# Patient Record
Sex: Female | Born: 1970
Health system: Southern US, Community
[De-identification: ages and names within clinical notes are randomized; demographics above are authoritative.]

## PROBLEM LIST (undated history)

## (undated) DIAGNOSIS — M722 Plantar fascial fibromatosis: Secondary | ICD-10-CM

## (undated) DIAGNOSIS — Z8619 Personal history of other infectious and parasitic diseases: Secondary | ICD-10-CM

## (undated) DIAGNOSIS — IMO0001 Reserved for inherently not codable concepts without codable children: Secondary | ICD-10-CM

## (undated) DIAGNOSIS — T7840XA Allergy, unspecified, initial encounter: Secondary | ICD-10-CM

## (undated) DIAGNOSIS — Z9889 Other specified postprocedural states: Secondary | ICD-10-CM

## (undated) DIAGNOSIS — R112 Nausea with vomiting, unspecified: Secondary | ICD-10-CM

## (undated) DIAGNOSIS — I10 Essential (primary) hypertension: Secondary | ICD-10-CM

## (undated) HISTORY — DX: Essential (primary) hypertension: I10

## (undated) HISTORY — DX: Personal history of other infectious and parasitic diseases: Z86.19

## (undated) HISTORY — DX: Plantar fascial fibromatosis: M72.2

## (undated) HISTORY — DX: Reserved for inherently not codable concepts without codable children: IMO0001

## (undated) HISTORY — DX: Allergy, unspecified, initial encounter: T78.40XA

---
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
## 1996-11-30 DIAGNOSIS — IMO0001 Reserved for inherently not codable concepts without codable children: Secondary | ICD-10-CM

## 1996-11-30 HISTORY — PX: OTHER SURGICAL HISTORY: SHX169

## 1996-11-30 HISTORY — DX: Reserved for inherently not codable concepts without codable children: IMO0001

## 1997-11-30 HISTORY — PX: WISDOM TOOTH EXTRACTION: SHX21

## 2008-07-31 ENCOUNTER — Encounter: Admission: RE | Admit: 2008-07-31 | Discharge: 2008-07-31 | Payer: Self-pay | Admitting: Family Medicine

## 2008-08-16 LAB — CONVERTED CEMR LAB: Pap Smear: NORMAL

## 2009-02-07 ENCOUNTER — Ambulatory Visit: Payer: Self-pay | Admitting: *Deleted

## 2009-02-07 DIAGNOSIS — B019 Varicella without complication: Secondary | ICD-10-CM | POA: Insufficient documentation

## 2009-02-07 DIAGNOSIS — R51 Headache: Secondary | ICD-10-CM | POA: Insufficient documentation

## 2009-02-07 DIAGNOSIS — G43909 Migraine, unspecified, not intractable, without status migrainosus: Secondary | ICD-10-CM | POA: Insufficient documentation

## 2009-02-07 DIAGNOSIS — J309 Allergic rhinitis, unspecified: Secondary | ICD-10-CM | POA: Insufficient documentation

## 2009-02-07 DIAGNOSIS — I10 Essential (primary) hypertension: Secondary | ICD-10-CM | POA: Insufficient documentation

## 2009-02-07 DIAGNOSIS — R519 Headache, unspecified: Secondary | ICD-10-CM | POA: Insufficient documentation

## 2009-02-07 LAB — CONVERTED CEMR LAB
BUN: 12 mg/dL (ref 6–23)
CO2: 30 meq/L (ref 19–32)
Calcium: 8.8 mg/dL (ref 8.4–10.5)
Chloride: 105 meq/L (ref 96–112)
Creatinine, Ser: 0.8 mg/dL (ref 0.4–1.2)
GFR calc Af Amer: 104 mL/min
GFR calc non Af Amer: 86 mL/min
Glucose, Bld: 76 mg/dL (ref 70–99)
Potassium: 3.8 meq/L (ref 3.5–5.1)
Sodium: 142 meq/L (ref 135–145)

## 2009-03-12 ENCOUNTER — Ambulatory Visit: Payer: Self-pay | Admitting: Family Medicine

## 2009-03-12 ENCOUNTER — Encounter: Payer: Self-pay | Admitting: Family Medicine

## 2009-03-12 ENCOUNTER — Other Ambulatory Visit: Admission: RE | Admit: 2009-03-12 | Discharge: 2009-03-12 | Payer: Self-pay | Admitting: Family Medicine

## 2009-03-12 DIAGNOSIS — R635 Abnormal weight gain: Secondary | ICD-10-CM | POA: Insufficient documentation

## 2009-03-12 DIAGNOSIS — N879 Dysplasia of cervix uteri, unspecified: Secondary | ICD-10-CM | POA: Insufficient documentation

## 2009-03-12 LAB — CONVERTED CEMR LAB
ALT: 13 units/L (ref 0–35)
AST: 19 units/L (ref 0–37)
Albumin: 3.8 g/dL (ref 3.5–5.2)
Alkaline Phosphatase: 74 units/L (ref 39–117)
BUN: 11 mg/dL (ref 6–23)
CO2: 28 meq/L (ref 19–32)
Calcium: 9 mg/dL (ref 8.4–10.5)
Chloride: 105 meq/L (ref 96–112)
Cholesterol: 128 mg/dL (ref 0–200)
Creatinine, Ser: 0.7 mg/dL (ref 0.4–1.2)
Direct LDL: 64.4 mg/dL
GFR calc non Af Amer: 99.92 mL/min (ref >60)
Glucose, Bld: 93 mg/dL (ref 70–99)
HDL: 36.1 mg/dL — ABNORMAL LOW (ref >39.00)
Potassium: 3.8 meq/L (ref 3.5–5.1)
Sodium: 140 meq/L (ref 135–145)
TSH: 1.8 microintl units/mL (ref 0.35–5.50)
Total Bilirubin: 0.8 mg/dL (ref 0.3–1.2)
Total Protein: 7.2 g/dL (ref 6.0–8.3)

## 2009-03-12 LAB — HM PAP SMEAR

## 2009-08-30 ENCOUNTER — Ambulatory Visit: Payer: Self-pay | Admitting: Internal Medicine

## 2009-10-31 ENCOUNTER — Ambulatory Visit: Payer: Self-pay | Admitting: Internal Medicine

## 2009-11-05 ENCOUNTER — Telehealth: Payer: Self-pay | Admitting: Internal Medicine

## 2010-01-30 ENCOUNTER — Ambulatory Visit: Payer: Self-pay | Admitting: Internal Medicine

## 2010-01-31 ENCOUNTER — Encounter (INDEPENDENT_AMBULATORY_CARE_PROVIDER_SITE_OTHER): Payer: Self-pay | Admitting: *Deleted

## 2010-02-13 ENCOUNTER — Telehealth (INDEPENDENT_AMBULATORY_CARE_PROVIDER_SITE_OTHER): Payer: Self-pay | Admitting: *Deleted

## 2011-01-01 NOTE — Assessment & Plan Note (Signed)
Summary: 3 MONTH FOLLOW UP/MHF   Vital Signs:  Patient profile:   40 year old [name] Menstrual status:  regular Weight:      204.25 pounds BMI:     34.11 O2 Sat:      98 % on Room air Temp:     97.6 degrees F oral Pulse rate:   74 / minute Pulse rhythm:   regular Resp:     [place] per minute BP sitting:   120 / 80  (right arm) Cuff size:   large  Vitals Entered By: [name] (January 30, 2010 3:25 PM)  O2 Flow:  Room air CC: RM 3- 3 month follow up  Comments no concerns, refill for HCTZ   Primary Care Provider:  [name]  CC:  RM 3- 3 month follow up .  History of Present Illness:  Hypertension Follow-Up      This is a 40 year old woman who presents for Hypertension follow-up.  The patient reports urinary frequency, but denies lightheadedness and headaches.  The patient denies the following associated symptoms: chest pain.  Compliance with medications (by patient report) has been near 100%.  The patient reports that dietary compliance has been fair.    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   Neck:  supple and no masses.   Lungs:  normal respiratory effort and normal breath sounds.   Heart:  normal rate, regular rhythm, and no gallop.   Abdomen:  soft, non-tender, and normal bowel  sounds.   Extremities:  no LE edema   Impression & Recommendations:  Problem # 1:  HYPERTENSION (ICD-401.9) well controlled.   Maintain current medication regimen.  Her updated medication list for this problem includes:    Hydrochlorothiazide 12.5 Mg Caps (Hydrochlorothiazide) .Marland Kitchen... 1 by mouth once daily    Labetalol Hcl 100 Mg Tabs (Labetalol hcl) ..... One half by mouth bid  BP today: 120/80 Prior BP: 136/90 (10/31/2009)  Labs Reviewed: K+: 3.8 (03/12/2009) Creat: : 0.7 (03/12/2009)   Chol: 128 (03/12/2009)   HDL: 36.10 (03/12/2009)     Problem # 2:  HEADACHE (ICD-784.0) Assessment: Improved continue b blocker  Her updated medication list for this problem includes:    Labetalol Hcl 100 Mg Tabs (Labetalol hcl) ..... One half by mouth bid  Problem # 3:  FAMILY PLANNING (ICD-V25.09) Pt and husband trying for first baby.  pt adivsed to take pre natal vitamins.  refer to local OB Orders: Obstetric Referral (Obstetric)  Complete Medication List: 1)  Previfem 0.25-35 Mg-mcg Tabs (Norgestimate-eth estradiol) .Marland Kitchen.. 1 tab by mouth daily 2)  Hydrochlorothiazide 12.5 Mg Caps (Hydrochlorothiazide) .Marland Kitchen.. 1 by mouth once daily 3)  Promethazine Hcl 12.5 Mg Tabs (Promethazine hcl) .... One by mouth two times a day prn 4)  Labetalol Hcl 100 Mg Tabs (Labetalol hcl) .... One half by mouth bid Prescriptions: LABETALOL HCL  100 MG TABS (LABETALOL HCL) one half by mouth bid  #90 x 3   Entered and Authorized by:   D. Thomos Lemons DO   Signed by:   D. Thomos Lemons DO on 01/30/2010   Method used:   Faxed to ...       [facility] Delivery Fax) (mail-order)             ,          Ph: [phone]       Fax: [phone]   RxID:   [id number] HYDROCHLOROTHIAZIDE 12.5 MG CAPS (HYDROCHLOROTHIAZIDE) 1 by mouth once daily  #90 x 3   Entered and Authorized by:   D. Thomos Lemons DO   Signed by:   D. Thomos Lemons DO on 01/30/2010   Method used:   Faxed to ...       [facility] Delivery  Fax) (mail-order)             ,          Ph: [phone]       Fax: [phone]   RxID:   [id number] LABETALOL HCL 100 MG TABS (LABETALOL HCL) one half by mouth bid  #90 x 3   Entered and Authorized by:   D. Thomos Lemons DO   Signed by:   D. Thomos Lemons DO on 01/30/2010   Method used:   Print then Give to Patient   RxID:   [id number] HYDROCHLOROTHIAZIDE 12.5 MG CAPS (HYDROCHLOROTHIAZIDE) 1 by mouth once daily  #90 x 3   Entered and Authorized by:   D. Thomos Lemons DO   Signed by:   D. Thomos Lemons DO on 01/30/2010   Method used:   Print then Give to Patient   RxID:   [id number]    Orders Added: 1)  Obstetric Referral [Obstetric] 2)  Est. Patient Level III [32202]   Current Allergies (reviewed today): No known allergies

## 2011-01-01 NOTE — Assessment & Plan Note (Signed)
Summary: CPE with pap   Vital Signs:  Patient profile:   40 year old female Menstrual status:  regular LMP:     02/13/2009 Height:      65 inches Weight:      197 pounds BMI:     32.90 O2 Sat:      99 % Pulse rate:   70 / minute BP sitting:   126 / 80  (left arm) Cuff size:   large  Vitals Entered By: Harlene Salts (March 12, 2009 8:58 AM)  History of Present Illness: CPE no pap  40 yo WF presents for CPE.  Had her pap smear with Dr Presley Raddle in September.  Has had abnormal paps.  Hx of dysplasia.  She had a LEEP done in the past.  Her periods are regular.  On OCPs.  No breakthrough bleeding.  No problems with discharge.  Monogamous.  She is nulligravid.  Not desiring to get pregnant.  She is on HCTZ for BP.  Due for RFs.    Aunt with breast cancer, postmenopausal. No fam hx of colon cancer. No fam hx of premature heart dz.  Tetanus updated in 2004.   She takes MVI on occassion.  She irregularly exercises.    Allergies: No Known Drug Allergies  Past History:  Past Medical History:    Reviewed history from 02/07/2009 and no changes required:    Current Problems:     MIGRAINE HEADACHE (ICD-346.90)    HYPERTENSION (ICD-401.9)    ALLERGY (ICD-995.3)    HEADACHE (ICD-784.0)    CHICKENPOX (ICD-052.9)  Past Surgical History:    Reviewed history from 02/07/2009 and no changes required:    knee surgery 1986    dyplasia 1998    wisdom teeth 1999  Family History:    Reviewed history from 02/07/2009 and no changes required:       Family History of Alcoholism/Addiction       Family History of  Emotional/Mental illness       Family History Lung cancer       Family History of  breast cancer       Family History of  stroke  Social History:    Reviewed history from 02/07/2009 and no changes required:       Occupation:Engineer       Single       Never Smoked       Alcohol use-yes  Review of Systems  The patient denies anorexia, fever, weight loss, weight gain, vision  loss, decreased hearing, hoarseness, chest pain, syncope, dyspnea on exertion, peripheral edema, prolonged cough, headaches, hemoptysis, abdominal pain, melena, hematochezia, severe indigestion/heartburn, hematuria, incontinence, genital sores, muscle weakness, suspicious skin lesions, transient blindness, difficulty walking, depression, unusual weight change, abnormal bleeding, enlarged lymph nodes, angioedema, breast masses, and testicular masses.    Physical Exam  General:  alert, well-developed, well-nourished, and well-hydrated.  obese. Head:  normocephalic and atraumatic.   Eyes:  vision grossly intact.   Ears:  no external deformities.   Nose:  no external deformity and no nasal discharge.   Mouth:  good dentition and pharynx pink and moist.   Neck:  supple and no masses.   Breasts:  had CBE 9-09 Lungs:  Normal respiratory effort, chest expands symmetrically. Lungs are clear to auscultation, no crackles or wheezes. Heart:  Normal rate and regular rhythm. S1 and S2 normal without gallop, murmur, click, rub or other extra sounds. Abdomen:  soft, non-tender, normal bowel sounds, no distention, no masses, no  guarding, no hepatomegaly, and no splenomegaly.   Genitalia:  Pelvic Exam:        External: normal female genitalia without lesions or masses        Vagina: normal without lesions or masses        Cervix: normal without lesions or masses        Adnexa: normal bimanual exam without masses or fullness        Uterus: normal by palpation        Pap smear: performed Pulses:  2+ radial pulses Extremities:  no LE edema Skin:  color normal.   Cervical Nodes:  No lymphadenopathy noted Psych:  good eye contact, not anxious appearing, and flat affect.     Impression & Recommendations:  Problem # 1:  Gynecological examination-routine (ICD-V72.31) Thin prep pap updated.  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 RFd OCPs and HCTZ.  BP at goal. BMI obese.  Work on diet, exercise  and wt loss. Add MVI and Calcium/ D daily. Tetanus is UTD. Update labs.  Mammogram at 40.    Complete Medication List: 1)  Previfem 0.25-35 Mg-mcg Tabs (Norgestimate-eth estradiol) .Marland Kitchen.. 1 tab by mouth daily 2)  Hydrochlorothiazide 12.5 Mg Caps (Hydrochlorothiazide) .Marland Kitchen.. 1 by mouth once daily  Other Orders: TLB-CMP (Comprehensive Metabolic Pnl) (80053-COMP) TLB-Cholesterol, Total (82465-CHO) TLB-Cholesterol, HDL (83718-HDL) TLB-Cholesterol, Direct LDL (83721-DIRLDL) TLB-TSH (Thyroid Stimulating Hormone) (84443-TSH)  Patient Instructions: 1)  Pap smear updated today. 2)  Will call you w/ results in 2-3 days. 3)  Update fasting labs today. 4)  Will call you w/ results. 5)  Add a Women's MVI and Calcium/D daily. 6)  Work on [employer] and regular exercise.  7)  F/U for High BP in 6 months. Prescriptions: HYDROCHLOROTHIAZIDE 12.5 MG CAPS (HYDROCHLOROTHIAZIDE) 1 by mouth once daily  #90 x 2   Entered and Authorized by:   Seymour Bars DO   Signed by:   Seymour Bars DO on 03/12/2009   Method used:   Print then Give to Patient   RxID:   [id number] PREVIFEM 0.25-35 MG-MCG TABS (NORGESTIMATE-ETH ESTRADIOL) 1 tab by mouth daily  #3 months x 3   Entered and Authorized by:   Seymour Bars DO   Signed by:   Seymour Bars DO on 03/12/2009   Method used:   Print then Give to Patient   RxID:   [id number]

## 2011-01-01 NOTE — Letter (Signed)
Summary: Primary Care Consult Scheduled Letter  [place] [facility]  [address]   [place], [place] [address]   Phone: [phone]  Fax: [phone]      01/31/2010 MRN: [id number]  Kaliyan Mcwatters [address] [place], [place]  [address]    Dear Ms. Phebus,      We have scheduled an appointment for you.  At the recommendation of Dr.YOO, we have scheduled you a consult with DR Zelphia Cairo, PHYSICIAN FOR WOMEN  on MARCH 9,2011  at 2:45PM , ARRIVE 2:15PM .  Their address is [address],   [place]  . The office phone number is [phone].  If this appointment day and time is not convenient for you, please feel free to call the office of the doctor you are being referred to at the number listed above and reschedule the appointment.     It is important for you to keep your scheduled appointments. We are here to make sure you are given good patient care. If you have questions or you have made changes to your appointment, please notify us at  [phone], ask for HELEN.    Thank you,  Patient Care Coordinator  at [facility]

## 2011-01-01 NOTE — Assessment & Plan Note (Signed)
Summary: Headache/mhf   Vital Signs:  Patient profile:   40 year old female Menstrual status:  regular Height:      65 inches Weight:      202 pounds Temp:     98.0 degrees F oral Pulse rate:   60 / minute BP sitting:   136 / 100  (left arm) Cuff size:   regular  Vitals Entered By: Kathlene November (August 30, 2009 3:53 PM) CC: Headache   Primary Care Provider:  [name]  CC:  Headache.  History of Present Illness:  Headaches      This is a 40 year old woman who presents with Headaches.  The patient denies nausea, vomiting, sinus pain, and sinus pressure.  The headache is described as intermittent.  The patient denies the following high-risk features: fever.  She has hx of presumed sinus problems.  Previous sinus treatment have not helped headaches.  Current Medications (verified): 1)  Previfem 0.25-35 Mg-Mcg Tabs (Norgestimate-Eth Estradiol) .Marland Kitchen.. 1 Tab By Mouth Daily 2)  Hydrochlorothiazide 12.5 Mg Caps (Hydrochlorothiazide) .Marland Kitchen.. 1 By Mouth Once Daily  Allergies (verified): No Known Drug Allergies  Comments:  Nurse/Medical Assistant: The patient's medications and allergies were reviewed with the patient and were updated in the Medication and Allergy Lists. 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   Head:  normocephalic and atraumatic.   Eyes:  vision grossly intact, pupils equal, pupils round, and pupils reactive to light.   Ears:  R ear normal and L ear normal.   Mouth:  Oral mucosa and oropharynx without lesions or exudates.  Teeth in good repair. Neck:  supple and no masses.   Lungs:  normal respiratory effort and normal  breath sounds.   Heart:  normal rate, regular rhythm, and no gallop.   Neurologic:  cranial nerves II-XII intact, strength normal in all extremities, gait normal, and finger-to-nose normal.   Psych:  normally interactive, good eye contact, not anxious appearing, and not depressed appearing.     Impression & Recommendations:  Problem # 1:  HEADACHE (ICD-784.0) 40 y/o white female with intermittent headaches.  Prev exacerbations attributed to sinus infecttion/problem.  I suspect migraines.  Start labetalol for headache prophylaxis.  She is getting married and she may want to start family which precludes use of other agents like topamax or ACE inhibitors.  Her updated medication list for this problem includes:    Labetalol Hcl 100 Mg Tabs (Labetalol hcl) ..... One half by mouth bid  Complete Medication List: 1)  Previfem 0.25-35 Mg-mcg Tabs (Norgestimate-eth estradiol) .Marland Kitchen.. 1 tab by mouth daily 2)  Hydrochlorothiazide 12.5 Mg Caps (Hydrochlorothiazide) .Marland Kitchen.. 1 by mouth once daily 3)  Promethazine Hcl 12.5 Mg Tabs (Promethazine hcl) .... One by mouth two times a day prn 4)  Labetalol Hcl 100 Mg Tabs (Labetalol hcl) .... One half by mouth bid  Patient Instructions: 1)  Please schedule a follow-up appointment in 2 months. Prescriptions: LABETALOL HCL 100 MG TABS (LABETALOL HCL) one half by mouth bid  #30 x 2   Entered and Authorized by:   D. Thomos Lemons DO   Signed by:   D. Thomos Lemons DO on 08/30/2009  Method used:   Print then Give to Patient   RxID:   [id number] PROMETHAZINE HCL 12.5 MG TABS (PROMETHAZINE HCL) one by mouth two times a day prn  #30 x 0   Entered and Authorized by:   D. Thomos Lemons DO   Signed by:   D. Thomos Lemons DO on 08/30/2009   Method used:   Print then Give to Patient   RxID:   [id number]

## 2011-01-01 NOTE — Assessment & Plan Note (Signed)
Summary: 2 month follow up/mhf   Vital Signs:  Patient profile:   40 year old female Menstrual status:  regular Weight:      202.75 pounds BMI:     33.86 O2 Sat:      97 % on Room air Temp:     98.1 degrees F oral Pulse rate:   78 / minute Pulse rhythm:   regular Resp:     16 per minute BP sitting:   136 / 90  (right arm) Cuff size:   large  Vitals Entered By: [name] (October 31, 2009 3:20 PM)  O2 Flow:  Room air  Primary Care Kairos Panetta:  D. Thomos Lemons DO  CC:  2 Month Follow up.  History of Present Illness: 2 Month follow up  40 year old white female for followup for migraines and htn. Patient only using labetalol- half tablet at bedtime. Headaches are  improved.  Htn - good medication compliance with diuretic.  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   Lungs:  normal respiratory effort and normal breath sounds.   Heart:  normal rate, regular rhythm, and no gallop.   Neurologic:  cranial nerves II-XII intact and gait normal.     Impression & Recommendations:  Problem # 1:  HEADACHE (ICD-784.0) Assessment Unchanged Pt has been only taking labetalol at bed time.  I advised pt to take two times a day.    Her updated medication list for this problem includes:    Labetalol Hcl 100 Mg Tabs (Labetalol hcl) ..... One half by mouth bid  Problem # 2:  HYPERTENSION (ICD-401.9) Assessment: Improved Take labetalol two times a day instead of at bedtime.   monitor BP and HR at home. Her updated medication list for this problem includes:    Hydrochlorothiazide 12.5 Mg Caps (Hydrochlorothiazide) .Marland Kitchen... 1 by mouth once daily    Labetalol Hcl 100 Mg Tabs (Labetalol hcl) ..... One half by mouth bid  BP today: 136/90 Prior BP: 136/100 (08/30/2009)  Labs Reviewed: K+: 3.8 (03/12/2009) Creat: : 0.7 (03/12/2009)   Chol: 128 (03/12/2009)   HDL: 36.10 (03/12/2009)     Complete Medication List: 1)  Previfem 0.25-35 Mg-mcg Tabs (Norgestimate-eth estradiol) .Marland Kitchen.. 1 tab by mouth daily 2)  Hydrochlorothiazide 12.5 Mg Caps (Hydrochlorothiazide) .Marland Kitchen.. 1 by mouth once daily 3)  Promethazine Hcl 12.5 Mg Tabs (Promethazine hcl) .... One by mouth two times a day prn 4)  Labetalol Hcl 100 Mg Tabs (Labetalol hcl) .... One half by mouth bid  Patient Instructions: 1)  Please schedule a follow-up appointment in 3 months. Prescriptions: LABETALOL HCL 100 MG TABS (LABETALOL HCL) one half by mouth bid  #90 x 3   Entered and Authorized by:   D. Thomos Lemons DO   Signed by:   D. Thomos Lemons DO on 10/31/2009   Method used:   Faxed to ...       [facility] Delivery Fax) (mail-order)             ,          Ph: [phone]  Fax: [phone]   RxID:   [id number]   Current Allergies (reviewed today): No known allergies     Immunization History:  Influenza Immunization History:    Influenza:  declined (10/31/2009)   Contraindications/Deferment of Procedures/Staging:    Test/Procedure: FLU VAX    Reason for deferment: patient declined

## 2011-01-01 NOTE — Progress Notes (Signed)
Summary: HCTZ on hold  Phone Note Call from Patient   Summary of Call: Pt. has a question Re: her scripts .... How they are ordered and how the refills work. Call back patient at  [phone] Initial call taken by: Michaelle Copas,  February 13, 2010 1:51 PM  Follow-up for Phone Call        Pt. states that she asked Dr. Artist Pais to update her medications at last visit. HCTZ refills were sent to [facility]. Pt saw Dr. Vickey Sages (GYN) and was told to stop HCTZ as pt. is planning to start a family.  Pt states that [facility] usually calls her before sending out refills on her meds but they did not do that this time. She now has 90 day supply of HCTZ that she is no longer taking.  She states that she was told to call us and have [place] call [facility] and tell them we misfilled the prescription. I advised pt. to call [facility] and speak to a manager to see if they can correct their error in not calling the pt. before shipping the med. We did not misfill her script. I will take HCTZ off her med list as it is on hold at present. Follow-up by: Mervin Kung CMA,  February 13, 2010 2:53 PM

## 2011-01-01 NOTE — Progress Notes (Signed)
Summary: Pharmacy verification  Phone Note Outgoing Call   Call placed by: [name],  November 05, 2009 11:37 AM Call placed to: Patient Summary of Call: call placed to patient  at [phone] for verification of where she would to have her Labetolol sent. Fax was received from [facility] stating patient has inactive coverage through their pharmacy. Initial call taken by: [name],  November 05, 2009 11:38 AM  Follow-up for Phone Call        Please resend to [facility] because they denied her med. because of her name change, but pt. states that she got it all straight now, if you will just resend. Thank you Follow-up by: Michaelle Copas,  November 07, 2009 9:43 AM  Additional Follow-up for Phone Call Additional follow up Details #1::        Phone call completed, rx faxed to pharmacy Additional Follow-up by: [name],  November 07, 2009 9:47 AM    Prescriptions: LABETALOL HCL 100 MG TABS (LABETALOL HCL) one half by mouth bid  #90 x 3   Entered by:   [name]   Authorized by:   D. Thomos Lemons DO   Signed by:   [name] on 11/07/2009   Method used:   Faxed to ...       [facility] Delivery Fax) (mail-order)             ,          Ph: [phone]       Fax: [phone]   RxID:   [id number]

## 2011-01-01 NOTE — Assessment & Plan Note (Signed)
Summary: NEW TO EST SINUS INFECTION-CH   Vital Signs:  Patient profile:   40 year old female Menstrual status:  regular LMP:     12/29/2008 Height:      65 inches Weight:      201 pounds BMI:     33.57 Temp:     98.6 degrees F oral Pulse rate:   88 / minute Pulse rhythm:   regular Resp:     20 per minute BP sitting:   148 / 98  (right arm) Cuff size:   regular  Vitals Entered By: Darra Lis RMA (February 07, 2009 1:38 PM) CC: sinusitis, Headache Is Patient Diabetic? No LMP (date): 12/29/2008  years   days  Menstrual Status regular Enter LMP: 12/29/2008 Last PAP Result normal   History of Present Illness: Patient is here because of worsening sinus pressure and headaches x 3 weeks.  Patient with a past history of sinusitis .  The patient complains of nasal congestion, purulent nasal discharge, and productive cough, but denies sore throat, earache, and sick contacts.  The patient denies fever, stiff neck, dyspnea, wheezing, rash, vomiting, and diarrhea.  The patient also reports itchy throat, sneezing, and headache.  The patient denies itchy watery eyes, muscle aches, and severe fatigue.    patient with a history of seasonal allergies, but the current symptoms are not like her typical allergy symptoms - it is like past episodes of sinusitis.  rarely uses meds for allergy symptoms.  Patient was prescribed blood pressure medication by her GYN last year, but she stopped taking it because it made her feel strange. Patient is currently taking nothing for her blood pressure - for several months.  patient with a history of migraines - uses over the counter meds.  has had some increased headaches in the past 3 weeks, but not migraine type headache - they are more related to sinusitis per patient.  patient is UTD with her pap smears   Preventive Screening-Counseling & Management     Alcohol drinks/day: 3 on the weekends     Alcohol type: wine     Feels need to cut down: no     Feels  annoyed by complaints: no     Feels guilty re: drinking: no     Needs 'eye opener' in am: no     Smoking Status: never     Passive Smoke Exposure: no     Caffeine use/day: 1 a day     Does Patient Exercise: yes     Type of exercise: cardio/strength training/walking     Seat Belt Use: yes  Current Medications (verified): 1)  Previfem 0.25-35 Mg-Mcg Tabs (Norgestimate-Eth Estradiol) .... Use As Directed  Allergies (verified): No Known Drug Allergies  Past Medical History:    Current Problems:     MIGRAINE HEADACHE (ICD-346.90)    HYPERTENSION (ICD-401.9)    ALLERGY (ICD-995.3)    HEADACHE (ICD-784.0)    CHICKENPOX (ICD-052.9)      Past Surgical History:    knee surgery 1986    dyplasia 1998    wisdom teeth 1999  Family History:    Family History of Alcoholism/Addiction    Family History of  Emotional/Mental illness    Family History Lung cancer    Family History of  breast cancer    Family History of  stroke  Social History:    Occupation:Engineer    Single    Never Smoked    Alcohol use-yes    Smoking Status:  never    Passive Smoke Exposure:  no    Caffeine use/day:  1 a day    Does Patient Exercise:  yes    Seat Belt Use:  yes  Review of Systems  The patient denies anorexia, fever, weight loss, weight gain, vision loss, decreased hearing, chest pain, syncope, dyspnea on exertion, peripheral edema, prolonged cough, hemoptysis, abdominal pain, melena, hematochezia, severe indigestion/heartburn, hematuria, genital sores, muscle weakness, suspicious skin lesions, transient blindness, difficulty walking, depression, unusual weight change, abnormal bleeding, enlarged lymph nodes, angioedema, and breast masses.    Physical Exam  Additional Exam:  General:     Well-developed, well nourished, well hydrated, in no acute distress   Head:     Normocephalic and atraumatic without obvious abnormalities.  Eyes:     No corneal or conjunctival inflammation. EOMI. PERRLA.  fundi benign.  Vision grossly normal. Ears:     External ear shows no significant lesions or deformities.  Otoscopic examination reveals clear canals, tympanic membranes normal bilaterally. Hearing is grossly normal. Nose:     External nasal examination shows no deformity or inflammation. Nasal mucosa are pink and moist without lesions or exudates. some tenderness to palpation of sinuses Mouth:     Oral mucosa and oropharynx without lesions, erythema or exudates.  no postnasal drip and no tongue abnormalities.   Neck:     supple, full ROM  Chest Wall:     No deformities, masses, or tenderness noted. Lungs:     Normal respiratory effort, chest expands symmetrically with good air flow noted. Lungs clear to auscultation with no crackles, rales or wheezes.   Heart:     Normal rate and  rhythm. S1 and S2 normal, without gallop, murmur, click, rub  Abdomen:     Bowel sounds positive, abdomen soft and non-tender without masses, organomegaly or hernias noted. no distention  Msk:     No gross deformity noted of cervical, thoracic or lumbar spine. normal ROM. no muscle atrophy noted.   Extremities:     No clubbing, cyanosis, edema, or deformity noted, grossly normal full range of motion with all joints.   Neurologic:     alert & oriented X3,  gait normal.  no deficit in strength noted, no balance problems noted, neuro is grossly intact Skin:     Intact without suspicious lesions or rashes Psych:     Cognition and judgment appear intact. Alert and cooperative with normal attention span and concentration. No apparent delusions, illusions, hallucinations, normally interactive, good eye contact, not anxious or depressed appearing, and not agitated.      Impression & Recommendations:  Problem # 1:  SINUSITIS (ICD-473.9) will treat with augmentin.  Get plenty of rest, drink lots of clear liquids, and use Tylenol or Ibuprofen for fever and comfort. If sinus symptoms, then use warm moist compresses,  and over the counter decongestants (only as directed). Call if no improvement in 5-7 days, sooner if increasing pain, fever, or new symptoms.  Her updated medication list for this problem includes:    Amoxicillin-pot Clavulanate 875-125 Mg Tabs (Amoxicillin-pot clavulanate) .Marland Kitchen... 1 by mouth two times a day x 10 day  Problem # 2:  HYPERTENSION (ICD-401.9) will start HCTZ and follow up in one month Her updated medication list for this problem includes:    Hydrochlorothiazide 12.5 Mg Caps (Hydrochlorothiazide) .Marland Kitchen... 1 by mouth once daily  Orders: TLB-BMP (Basic Metabolic Panel-BMET) (80048-METABOL)  Problem # 3:  MIGRAINE HEADACHE (ICD-346.90) uses over the counter meds with good  relief  Problem # 4:  ALLERGIC RHINITIS (ICD-477.9)  over the counter meds prn  Her updated medication list for this problem includes:    Claritin 10 Mg Tabs (Loratadine) ..... One by mouth once daily as needed  Complete Medication List: 1)  Previfem 0.25-35 Mg-mcg Tabs (Norgestimate-eth estradiol) .... Use as directed 2)  Hydrochlorothiazide 12.5 Mg Caps (Hydrochlorothiazide) .Marland Kitchen.. 1 by mouth once daily 3)  Amoxicillin-pot Clavulanate 875-125 Mg Tabs (Amoxicillin-pot clavulanate) .Marland Kitchen.. 1 by mouth two times a day x 10 day 4)  Claritin 10 Mg Tabs (Loratadine) .... One by mouth once daily as needed  Patient Instructions: 1)  Please schedule a PE appointment in 1 month with MD in the am / fasting.  NOT just a lab appt.  2)  The medication list was reviewed and reconciled.  All changed / newly prescribed medications were explained.  A complete medication list was provided to the patient / caregiver. Prescriptions: AMOXICILLIN-POT CLAVULANATE 875-125 MG TABS (AMOXICILLIN-POT CLAVULANATE) 1 by mouth two times a day x 10 day  #20 x 0   Entered and Authorized by:   Paulo Fruit MD   Signed by:   Paulo Fruit MD on 02/07/2009   Method used:   [facility]        [facility] [phone]* (retail)        [address]       [place], [place]  [address]       Ph: [phone]       Fax: [phone]   RxID:   [id number] HYDROCHLOROTHIAZIDE 12.5 MG CAPS (HYDROCHLOROTHIAZIDE) 1 by mouth once daily  #30 x 1   Entered and Authorized by:   Paulo Fruit MD   Signed by:   Paulo Fruit MD on 02/07/2009   Method used:   [facility]        [facility] [phone]* (retail)       [address]       [place], [place]  [address]       Ph: [phone]       Fax: [phone]   RxID:   [id number]        Preventive Care Screening  Pap Smear:    Date:  08/16/2008    Results:  normal   Last Tetanus Booster:    Date:  02/21/2004    Results:  Tdap

## 2011-01-30 ENCOUNTER — Encounter: Payer: Self-pay | Admitting: Internal Medicine

## 2011-01-30 ENCOUNTER — Ambulatory Visit (INDEPENDENT_AMBULATORY_CARE_PROVIDER_SITE_OTHER): Payer: BC Managed Care – PPO | Admitting: Internal Medicine

## 2011-01-30 DIAGNOSIS — I10 Essential (primary) hypertension: Secondary | ICD-10-CM

## 2011-01-30 LAB — CONVERTED CEMR LAB

## 2011-02-17 NOTE — Assessment & Plan Note (Signed)
Summary: 1 YEAR FOLLOW UP/MHF   Vital Signs:  Patient profile:   40 year old female Menstrual status:  regular Height:      65 inches Weight:      209.75 pounds BMI:     35.03 O2 Sat:      99 % Temp:     98.3 degrees F oral Pulse rate:   84 / minute Resp:     18 per minute BP sitting:   116 / 80  (right arm) Cuff size:   large  Vitals Entered By: [name] (January 30, 2011 3:17 PM) CC: Yearly follow up Is Patient Diabetic? No Pain Assessment Patient in pain? no      Comments no concerns     Last PAP Result due   Primary Care Provider:  [name]  CC:  Yearly follow up.  History of Present Illness: 40 year old white female for routine followup No significant interval history Patient still trying to conceive.  it has been approximately one year  menstrual cycle can be somewhat irregular she ranges from q. 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  Alcohol-Tobacco     Alcohol drinks/day: <1     Smoking Status: never  Caffeine-Diet-Exercise     Caffeine use/day: 2 beverages daily     Does Patient Exercise: no  Allergies (verified): No Known Drug Allergies  Past History:  Past Medical History: Current Problems:  MIGRAINE HEADACHE (ICD-346.90) HYPERTENSION (ICD-401.9)  ALLERGY (ICD-995.3)   HEADACHE (ICD-784.0) CHICKENPOX (ICD-052.9)   Hx of plantar fascitis  Family History: Family History of Alcoholism/Addiction Family History of  Emotional/Mental illness Family History Lung cancer  Family History of  breast cancer Family History of  stroke     Social History: 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   The patient denies fever, chest pain, syncope, dyspnea on exertion, abdominal pain, melena, hematochezia, and severe indigestion/heartburn.    Physical Exam  General:  alert, well-developed, and well-nourished.   Lungs:  normal respiratory effort and normal breath sounds.   Heart:  normal rate, regular rhythm, and no gallop.   Extremities:  trace left pedal edema and trace right pedal edema.   Psych:  normally interactive, good eye contact, not anxious appearing, and not depressed appearing.     Impression & Recommendations:  Problem # 1:  HYPERTENSION (ICD-401.9) Assessment Unchanged bp is stable. she notes occ elevated readings he has health screening at work pt to forward blood test results  encouraged regular exercise  Her updated medication list for this problem includes:    Labetalol Hcl 100 Mg Tabs (Labetalol hcl) ..... One half by mouth bid  BP today: 116/80 Prior BP: 120/80 (01/30/2010)  Labs Reviewed: K+: 3.8 (03/12/2009) Creat: : 0.7 (03/12/2009)   Chol: 128 (03/12/2009)   HDL: 36.10 (03/12/2009)     Complete Medication List: 1)  Labetalol Hcl 100 Mg Tabs (Labetalol hcl) .... One half by mouth bid 2)  Prenatal 19 Tabs (Prenatal vit-dss-fe fum-fa) .... Take 1 tablet by mouth once a day  Patient Instructions: 1)  Please schedule a follow-up appointment in 6 months. Prescriptions: LABETALOL HCL 100 MG TABS (LABETALOL  HCL) one half by mouth bid  #45 x 3   Entered and Authorized by:   D. Thomos Lemons DO   Signed by:   D. Thomos Lemons DO on 01/30/2011   Method used:   [facility]        [facility] [phone]* (retail)       [address]       [place], [place]  [address]       Ph: [phone]       Fax: [phone]   RxID:   [id number]    Orders Added: 1)  Est. Patient Level III [24401]   Immunization History:  Influenza Immunization History:    Influenza:  historical (08/19/2010)   Immunization  History:  Influenza Immunization History:    Influenza:  Historical (08/19/2010)    Preventive Care Screening  Pap Smear:    Date:  01/30/2011    Results:  due   Current Allergies (reviewed today): No known allergies

## 2011-03-04 ENCOUNTER — Telehealth: Payer: Self-pay | Admitting: *Deleted

## 2011-03-04 MED ORDER — LABETALOL HCL 100 MG PO TABS: ORAL_TABLET | ORAL | Status: DC

## 2011-03-04 NOTE — Telephone Encounter (Signed)
Pt called requesting [facility]ill on [facility] from [facility]/30/11. Med refilled.

## 2011-04-08 ENCOUNTER — Ambulatory Visit (INDEPENDENT_AMBULATORY_CARE_PROVIDER_SITE_OTHER): Payer: BC Managed Care – PPO | Admitting: Family

## 2011-04-08 ENCOUNTER — Encounter: Payer: Self-pay | Admitting: Family

## 2011-04-08 DIAGNOSIS — G43909 Migraine, unspecified, not intractable, without status migrainosus: Secondary | ICD-10-CM

## 2011-04-08 DIAGNOSIS — H6590 Unspecified nonsuppurative otitis media, unspecified ear: Secondary | ICD-10-CM

## 2011-04-08 DIAGNOSIS — H6593 Unspecified nonsuppurative otitis media, bilateral: Secondary | ICD-10-CM | POA: Insufficient documentation

## 2011-04-08 MED ORDER — AMOXICILLIN 500 MG PO CAPS: 1000.0000 mg | ORAL_CAPSULE | Freq: Three times a day (TID) | ORAL | Status: AC

## 2011-04-08 NOTE — Patient Instructions (Signed)
Claritin 10mg  once daily as needed for congestion. Tylenol 650mg  every 6 hours as needed for headache. Call if your symptoms worsen, or if they do not improve in 48-72 hours.

## 2011-04-08 NOTE — Progress Notes (Signed)
  Subjective:    Patient ID: Virginia [name], female    DOB: 01-Apr-1971, 40 y.o.   MRN: [id number]  HPI   [name] is a 40 yr old female who presents today with chief complaint of HA. Symptoms started 2 weeks ago and have been intermittent in nature.  She reports that symptoms are similar to her previous migraines though not as severe.  She has tried some sinus/allergy med without improvement.  Now having associated sinus pressure in her cheeks, white nasal discharge.  + sneezing.  Bilateral ear pain R>L.  Denies associated fever or sore throat.  + nausea, no vomitting.     Review of Systems See history present illness  Past Medical History  Diagnosis Date  . Migraine   . Allergy   . Hypertension   . History of chicken pox   . Plantar fasciitis   . Dysplasia 1998    History   Social History  . Marital Status: Single    Spouse Name: N/A    Number of Children: N/A  . Years of Education: N/A   Occupational History  . Not on file.   Social History Main Topics  . Smoking status: Never Smoker   . Smokeless tobacco: Never Used  . Alcohol Use: Yes  . Drug Use: Not on file  . Sexually Active: Not on file   Other Topics Concern  . Not on file   Social History Narrative  . No narrative on file    Past Surgical History  Procedure Date  . Knee surgery 1986  . Wisdom tooth extraction 1999    Family History  Problem Relation Age of Onset  . Alcohol abuse Other   . Mental illness Other   . Stroke Other   . Cancer Other     lung and breast    No Known Allergies  Current Outpatient Prescriptions on File Prior to Visit  Medication Sig Dispense Refill  . labetalol (NORMODYNE) 100 MG tablet Take 1/2 tablet by mouth twice a day.  90 tablet  1    BP 120/82  Pulse 72  Temp(Src) 97.8 F (36.6 C) (Oral)  Resp 16  Ht 5\' 5"  (1.651 m)  Wt 207 lb 1.3 oz (93.931 kg)  BMI 34.46 kg/m2        Objective:   Physical Exam General: Awake, alert, and in no acute  distress Head normocephalic/atraumatic Ears: Bilateral tympanic membranes with purulent effusions bilaterally,  right greater than left Throat: Large tonsils bilaterally, no exudates or erythema.  Cardiovascular: S1-S2 regular rate and rhythm, no murmur Respiratory: Breath sounds are clear to auscultation bilaterally without wheezes rales or rhonchi       Assessment & Plan:

## 2011-04-08 NOTE — Assessment & Plan Note (Signed)
We'll avoid Imitrex at this time as patient is currently trying to conceive, however I did recommend the use of when necessary Tylenol. She is already on a beta blocker for her hypertension.

## 2011-04-08 NOTE — Assessment & Plan Note (Signed)
We'll plan to treat with amoxicillin, for bilateral early otitis media. Suspect some associated mild sinusitis as well. The patient notes that she is currently trying to conceive. Will print scar the amoxicillin which is category B., as well as Claritin when necessary which is category B.

## 2011-07-10 ENCOUNTER — Ambulatory Visit: Payer: BC Managed Care – PPO | Admitting: Internal Medicine

## 2011-09-21 ENCOUNTER — Telehealth: Payer: Self-pay | Admitting: Internal Medicine

## 2011-09-21 NOTE — Telephone Encounter (Signed)
Refill- labetalol hcl 100mg . Take 1/2 tablet twice daily. Qty 90. Refills 1

## 2011-09-22 MED ORDER — LABETALOL HCL 100 MG PO TABS: ORAL_TABLET | ORAL | Status: DC

## 2011-09-22 NOTE — Telephone Encounter (Signed)
rx sent in electronically 

## 2011-10-28 ENCOUNTER — Encounter: Payer: Self-pay | Admitting: Internal Medicine

## 2011-10-28 ENCOUNTER — Ambulatory Visit (INDEPENDENT_AMBULATORY_CARE_PROVIDER_SITE_OTHER): Payer: BC Managed Care – PPO | Admitting: Internal Medicine

## 2011-10-28 VITALS — BP 108/70 | HR 76 | Temp 98.4°F | Resp 18 | Wt 204.0 lb

## 2011-10-28 DIAGNOSIS — R109 Unspecified abdominal pain: Secondary | ICD-10-CM | POA: Insufficient documentation

## 2011-10-28 LAB — CBC WITH DIFFERENTIAL/PLATELET
Basophils Absolute: 0.1 10*3/uL (ref 0.0–0.1)
Basophils Relative: 1 % (ref 0–1)
Eosinophils Absolute: 0.2 10*3/uL (ref 0.0–0.7)
Eosinophils Relative: 2 % (ref 0–5)
HCT: 45 % (ref 36.0–46.0)
Hemoglobin: 15.2 g/dL — ABNORMAL HIGH (ref 12.0–15.0)
Lymphocytes Relative: 22 % (ref 12–46)
Lymphs Abs: 2.5 10*3/uL (ref 0.7–4.0)
MCH: 31.7 pg (ref 26.0–34.0)
MCHC: 33.8 g/dL (ref 30.0–36.0)
MCV: 93.8 fL (ref 78.0–100.0)
Monocytes Absolute: 0.5 10*3/uL (ref 0.1–1.0)
Monocytes Relative: 5 % (ref 3–12)
Neutro Abs: 8 10*3/uL — ABNORMAL HIGH (ref 1.7–7.7)
Neutrophils Relative %: 71 % (ref 43–77)
Platelets: 332 10*3/uL (ref 150–400)
RBC: 4.8 MIL/uL (ref 3.87–5.11)
RDW: 13 % (ref 11.5–15.5)
WBC: 11.3 10*3/uL — ABNORMAL HIGH (ref 4.0–10.5)

## 2011-10-28 LAB — HEPATIC FUNCTION PANEL
ALT: 17 U/L (ref 0–35)
AST: 18 U/L (ref 0–37)
Albumin: 4.7 g/dL (ref 3.5–5.2)
Alkaline Phosphatase: 98 U/L (ref 39–117)
Bilirubin, Direct: 0.1 mg/dL (ref 0.0–0.3)
Indirect Bilirubin: 0.5 mg/dL (ref 0.0–0.9)
Total Bilirubin: 0.6 mg/dL (ref 0.3–1.2)
Total Protein: 7.3 g/dL (ref 6.0–8.3)

## 2011-10-28 LAB — BASIC METABOLIC PANEL
BUN: 13 mg/dL (ref 6–23)
CO2: 27 mEq/L (ref 19–32)
Calcium: 9.9 mg/dL (ref 8.4–10.5)
Chloride: 100 mEq/L (ref 96–112)
Creat: 0.76 mg/dL (ref 0.50–1.10)
Glucose, Bld: 89 mg/dL (ref 70–99)
Potassium: 4 mEq/L (ref 3.5–5.3)
Sodium: 138 mEq/L (ref 135–145)

## 2011-10-28 LAB — AMYLASE: Amylase: 48 U/L (ref 0–105)

## 2011-10-28 MED ORDER — OMEPRAZOLE 40 MG PO CPDR: 40.0000 mg | DELAYED_RELEASE_CAPSULE | Freq: Every day | ORAL | Status: DC

## 2011-10-28 NOTE — Progress Notes (Signed)
  Subjective:    Patient ID: Virginia [name], female    DOB: 1971-10-10, 40 y.o.   MRN: [id number]  HPI Pt presents to clinic for evaluation of abdominal pain. Notes several week h/o intermittent epigastric pain without radiation. Typically occurs at night. Only minimal nausea on occasion but no vomitting. Tends to be mildly constipated with episodes of abd pain but resolves. No blood in stool. Denies GERD sx's however preceding the onset of abd pain noted intermittent nighttime regurgitation of stomach contents. Pain often precipitated by certain foods (pizza) or etoh (no regular use.) no other alleviating or exacerbating factors. No other complaints.  Past Medical History  Diagnosis Date  . Migraine   . Allergy   . Hypertension   . History of chicken pox   . Plantar fasciitis   . Dysplasia 1998   Past Surgical History  Procedure Date  . Knee surgery 1986  . Wisdom tooth extraction 1999    reports that she has never smoked. She has never used smokeless tobacco. She reports that she drinks alcohol. Her drug history not on file. family history includes Alcohol abuse in her other; Cancer in her other; Mental illness in her other; and Stroke in her other. No Known Allergies     Review of Systems see hpi     Objective:   Physical Exam  Nursing note and vitals reviewed. Constitutional: She appears well-developed and well-nourished. No distress.  HENT:  Head: Normocephalic.  Eyes: Conjunctivae are normal. No scleral icterus.  Abdominal: Soft. Normal appearance and bowel sounds are normal. She exhibits no distension and no mass. There is no hepatosplenomegaly. There is no tenderness. There is no rebound and no guarding.  Skin: She is not diaphoretic.          Assessment & Plan:

## 2011-10-28 NOTE — Assessment & Plan Note (Signed)
Suspect possible gastritis. Begin omeprazole 40mg  qhs. Obtain cbc, chem7, lft, amylase. F/u 3wks or sooner if necessary.

## 2011-11-18 ENCOUNTER — Ambulatory Visit (INDEPENDENT_AMBULATORY_CARE_PROVIDER_SITE_OTHER): Payer: BC Managed Care – PPO | Admitting: Internal Medicine

## 2011-11-18 ENCOUNTER — Encounter: Payer: Self-pay | Admitting: Internal Medicine

## 2011-11-18 VITALS — BP 110/80 | HR 76 | Temp 98.2°F | Resp 18 | Wt 200.0 lb

## 2011-11-18 DIAGNOSIS — R109 Unspecified abdominal pain: Secondary | ICD-10-CM

## 2011-11-18 NOTE — Progress Notes (Signed)
  Subjective:    Patient ID: Virginia [name], female    DOB: 12/15/1970, 40 y.o.   MRN: [id number]  HPI Pt presents to clinic for follow up of abdominal pain. No improvement of pain with prilosec daily. No f/c, hematemesis, hematochezia or melena. No alleviating or exacerbating factors. No other complaints.   Past Medical History  Diagnosis Date  . Migraine   . Allergy   . Hypertension   . History of chicken pox   . Plantar fasciitis   . Dysplasia 1998   Past Surgical History  Procedure Date  . Knee surgery 1986  . Wisdom tooth extraction 1999    reports that she has never smoked. She has never used smokeless tobacco. She reports that she drinks alcohol. Her drug history not on file. family history includes Alcohol abuse in her other; Cancer in her other; Mental illness in her other; and Stroke in her other. No Known Allergies   Review of Systems see hpi    Objective:   Physical Exam  Nursing note and vitals reviewed. Constitutional: She appears well-developed and well-nourished.  HENT:  Head: Normocephalic and atraumatic.  Eyes: Conjunctivae are normal. No scleral icterus.  Abdominal: Soft. Normal appearance and bowel sounds are normal. She exhibits no distension and no mass. There is no hepatosplenomegaly. There is tenderness in the periumbilical area. There is no rigidity, no rebound and no guarding.  Neurological: She is alert.  Skin: Skin is warm and dry.  Psychiatric: She has a normal mood and affect.          Assessment & Plan:

## 2011-11-19 ENCOUNTER — Encounter: Payer: Self-pay | Admitting: Internal Medicine

## 2011-11-19 ENCOUNTER — Ambulatory Visit (INDEPENDENT_AMBULATORY_CARE_PROVIDER_SITE_OTHER): Payer: BC Managed Care – PPO | Admitting: Internal Medicine

## 2011-11-19 DIAGNOSIS — K219 Gastro-esophageal reflux disease without esophagitis: Secondary | ICD-10-CM

## 2011-11-19 DIAGNOSIS — R109 Unspecified abdominal pain: Secondary | ICD-10-CM

## 2011-11-19 MED ORDER — OMEPRAZOLE 40 MG PO CPDR: 40.0000 mg | DELAYED_RELEASE_CAPSULE | Freq: Every day | ORAL | Status: DC

## 2011-11-19 NOTE — Progress Notes (Signed)
HISTORY OF PRESENT ILLNESS:  Virginia [name] is a 40 y.o. female with a history of hypertension, migraine headaches, and obesity. She presents today for evaluation of new-onset abdominal pain. The patient reports developing acute epigastric pain in mid November. Since that time she has had 2 episodes per week. The pain is described as severe with some tenderness in the right upper quadrant. No radiation into the back. Some nausea but no vomiting. Attacks generally last 3-4 hours then abate. They have awoken her at night. She has been having reflux symptoms for months. Bowel symptoms slightly less regular. No improvement in symptoms with self-induced vomiting or defecation. She saw her primary provider on November 28. She was placed on PPI. Reflux symptoms resolved. Unfortunately, abdominal discomfort continued without change. She denies fevers, darkening of urine, or hematuria. Laboratories from November 28 or unremarkable including comprehensive metabolic panel with liver function tests, amylase, CBC. She does take an occasional NSAID for headache, though generally only once per week.  REVIEW OF SYSTEMS:  All non-GI ROS negative except for headaches  Past Medical History  Diagnosis Date  . Migraine   . Allergy   . Hypertension   . History of chicken pox   . Plantar fasciitis   . Dysplasia 1998    Past Surgical History  Procedure Date  . Knee surgery 1986  . Wisdom tooth extraction 1999    Social History [name]  reports that she has never smoked. She has never used smokeless tobacco. She reports that she drinks alcohol. Her drug history not on file.  family history includes Alcohol abuse in her father; Breast cancer in her maternal aunt; Diabetes in her mother; Lung cancer in her maternal grandfather; and Stroke in her paternal grandfather.  No Known Allergies     PHYSICAL EXAMINATION: Vital signs: BP 118/70  Pulse 82  Ht 5\' 5"  (1.651 m)  Wt 202 lb 6.4 oz (91.808 kg)  BMI  33.68 kg/m2  SpO2 98%  LMP 11/18/2011  Constitutional: generally well-appearing, no acute distress Psychiatric: alert and oriented x3, cooperative Eyes: extraocular movements intact, anicteric, conjunctiva pink Mouth: oral pharynx moist, no lesions Neck: supple no lymphadenopathy Cardiovascular: heart regular rate and rhythm, no murmur Lungs: clear to auscultation bilaterally Abdomen: Obese, soft, nontender, nondistended, no obvious ascites, no peritoneal signs, normal bowel sounds, no organomegaly Extremities: no lower extremity edema bilaterally Skin: no lesions on visible extremities Neuro: No focal deficits.   ASSESSMENT:  #1. 5 week history of recurrent epigastric pain as described. Symptoms most consistent with symptomatic cholelithiasis #2. GERD. Symptoms relief with PPI   PLAN:  #1. Continue PPI #2. Reflux precautions #3. Abdominal ultrasound rule out gallstones. If Gallstones found, surgical referral for laparoscopic cholecystectomy

## 2011-11-19 NOTE — Patient Instructions (Signed)
You have been scheduled for an abdominal ultrasound at [facility] (1st floor of hospital) on 11-25-11 at 8:30am. Please arrive 15 minutes prior to your appointment for registration. Make certain not to have anything to eat or drink 6 hours prior to your appointment. Should you need to reschedule your appointment, please contact radiology at [phone].  You may continue taking your Omeprazole

## 2011-11-22 ENCOUNTER — Encounter: Payer: Self-pay | Admitting: Internal Medicine

## 2011-11-22 NOTE — Assessment & Plan Note (Signed)
Attempt samples of dexilant qd. Proceed with gi consult

## 2011-11-25 ENCOUNTER — Telehealth: Payer: Self-pay

## 2011-11-25 ENCOUNTER — Ambulatory Visit (HOSPITAL_COMMUNITY)
Admission: RE | Admit: 2011-11-25 | Discharge: 2011-11-25 | Disposition: A | Discharge status: Home / Self Care | Payer: BC Managed Care – PPO | Source: Ambulatory Visit | Attending: Internal Medicine | Admitting: Internal Medicine

## 2011-11-25 DIAGNOSIS — K8020 Calculus of gallbladder without cholecystitis without obstruction: Secondary | ICD-10-CM | POA: Insufficient documentation

## 2011-11-25 DIAGNOSIS — R1013 Epigastric pain: Secondary | ICD-10-CM | POA: Insufficient documentation

## 2011-11-25 NOTE — Telephone Encounter (Signed)
Message copied by Michele Mcalpine on Wed Nov 25, 2011  4:37 PM ------      Message from: Hilarie Fredrickson      Created: Wed Nov 25, 2011  4:18 PM       Let pt know that the [place] gallstones. This is the cause for her recurrent abdominal pain. Please refer to general surgery for lap chole.

## 2011-11-25 NOTE — Telephone Encounter (Signed)
Pt aware and scheduled with Dr. Michaell Cowing for 12/13/10 arrival time 2:45pm for a 3:15pm appt. Pt aware of appt date and time.

## 2011-12-04 ENCOUNTER — Ambulatory Visit (INDEPENDENT_AMBULATORY_CARE_PROVIDER_SITE_OTHER): Payer: BC Managed Care – PPO | Admitting: Surgery

## 2011-12-04 ENCOUNTER — Encounter (INDEPENDENT_AMBULATORY_CARE_PROVIDER_SITE_OTHER): Payer: Self-pay | Admitting: Surgery

## 2011-12-04 VITALS — BP 122/82 | HR 79 | Temp 97.3°F | Ht 65.0 in | Wt 201.6 lb

## 2011-12-04 DIAGNOSIS — K8010 Calculus of gallbladder with chronic cholecystitis without obstruction: Secondary | ICD-10-CM

## 2011-12-04 MED ORDER — PROMETHAZINE HCL 12.5 MG PO TABS: 12.5000 mg | ORAL_TABLET | Freq: Four times a day (QID) | ORAL | Status: AC | PRN

## 2011-12-04 NOTE — Patient Instructions (Signed)
Cholelithiasis Cholelithiasis (also called gallstones) is a form of gallbladder disease where gallstones form in your gallbladder. The gallbladder is a non-essential organ that stores bile made in the liver, which helps digest fats. Gallstones begin as small crystals and slowly grow into stones. Gallstone pain occurs when the gallbladder spasms, and a gallstone is blocking the duct. Pain can also occur when a stone passes out of the duct.  Women are more likely to develop gallstones than men. Other factors that increase the risk of gallbladder disease are:  Having multiple pregnancies. Physicians sometimes advise removing diseased gallbladders before future pregnancies.   Obesity.   Diets heavy in fried foods and fat.   Increasing age (older than 25).   Prolonged use of medications containing female hormones.   Diabetes mellitus.   Rapid weight loss.   Family history of gallstones (heredity).  SYMPTOMS  Feeling sick to your stomach (nauseous).   Abdominal pain.   Yellowing of the skin (jaundice).   Sudden pain. It may persist from several minutes to several hours.   Worsening pain with deep breathing or when jarred.   Fever.   Tenderness to the touch.  In some cases, when gallstones do not move into the bile duct, people have no pain or symptoms. These are called "silent" gallstones. TREATMENT In severe cases, emergency surgery may be required. HOME CARE INSTRUCTIONS   Only take over-the-counter or prescription medicines for pain, discomfort, or fever as directed by your caregiver.   Follow a low-fat diet until seen again. Fat causes the gallbladder to contract, which can result in pain.   Follow up as instructed. Attacks are almost always recurrent and surgery is usually required for permanent treatment.  SEEK IMMEDIATE MEDICAL CARE IF:   Your pain increases and is not controlled by medications.   You have an oral temperature above 102 F (38.9 C), not controlled by  medication.   You develop nausea and vomiting.  MAKE SURE YOU:   Understand these instructions.   Will watch your condition.   Will get help right away if you are not doing well or get worse.  Document Released: 11/12/2005 Document Revised: 07/29/2011 Document Reviewed: 01/15/2011 [facility] Patient Information 2012 [place], [place].

## 2011-12-04 NOTE — Progress Notes (Signed)
Subjective:     Patient ID: Virginia [name], female   DOB: 12-02-1970, 41 y.o.   MRN: [id number]  HPI  Virginia [name]  07/18/1971 [id number]  Patient Care Team: Ala Dach Letitia Libra as PCP - General (Internal Medicine) Yancey Flemings, MD as Consulting Physician (Gastroenterology)  This patient is a 41 y.o.female who presents today for surgical evaluation at the request of Dr. Marina Goodell.   Reason for visit: Postprandial abdominal pain. Gallstones.  Patient is a pleasant obese female who's been struggling with intermittent abdominal pains for the past 2 months. It seems to be triggered by eating. Most recent attacks with ice cream and with sausage. She describes that epigastric fullness and hunger type deep pain. Pushes towards her back. Occasionally she feels it more on the right side. Never the left. She will feel very bloated. She will get nausea and vomiting. She's tried to adjust her diet Center stuff that she still gets attacks.  No fevers chills or sweats. No sick contacts or travel history. No alcohol intake. Mild history of heartburn and reflux in the past. She was given a trial of PPIs. She's been on for 2 months. It has not helped her symptoms at all.  Because of her suspicions of biliary colic workup revealed gallstones. She symptoms for surgical evaluation.  Patient Active Problem List  Diagnoses  . MIGRAINE HEADACHE  . HYPERTENSION  . ALLERGIC RHINITIS  . DYSPLASIA OF CERVIX UNSPECIFIED  . WEIGHT GAIN  . Abdominal  pain, other specified site  . Chronic cholecystitis with calculus    Past Medical History  Diagnosis Date  . Migraine   . Allergy   . Hypertension   . History of chicken pox   . Plantar fasciitis   . Dysplasia 1998    Past Surgical History  Procedure Date  . Knee surgery 1986  . Wisdom tooth extraction 1999  . Cervical displesia 1998    History   Social History  . Marital Status: Single    Spouse Name: N/A    Number of Children: N/A  . Years of  Education: N/A   Occupational History  . Engineer    Social History Main Topics  . Smoking status: Never Smoker   . Smokeless tobacco: Never Used  . Alcohol Use: 0.0 oz/week    2-3 Glasses of wine per week     week  . Drug Use: No  . Sexually Active: Not on file   Other Topics Concern  . Not on file   Social History Narrative   Pt has either 2 cups soda or coffee or green tea daily.    Family History  Problem Relation Age of Onset  . Alcohol abuse Father   . Stroke Paternal Grandfather   . Lung cancer Maternal Grandfather   . Breast cancer Maternal Aunt   . Cancer Maternal Aunt     breast/bone  . Diabetes Mother   . Cancer Maternal Grandmother     lung    Current outpatient prescriptions:labetalol (NORMODYNE) 100 MG tablet, Take 1/2 tablet by mouth twice a day., Disp: 90 tablet, Rfl: 0;  omeprazole (PRILOSEC) 40 MG capsule, Take 1 capsule (40 mg total) by mouth daily., Disp: 30 capsule, Rfl: 11;  promethazine (PHENERGAN) 12.5 MG tablet, Take 1-2 tablets (12.5-25 mg total) by mouth every 6 (six) hours as needed for nausea., Disp: 20 tablet, Rfl: 3  No Known Allergies  BP 122/82  Pulse 79  Temp 97.3 F (36.3 C)  Ht  5\' 5"  (1.651 m)  Wt 201 lb 9.6 oz (91.445 kg)  BMI 33.55 kg/m2  SpO2 97%  LMP 11/18/2011     Review of Systems  Constitutional: Negative for fever, chills, diaphoresis, appetite change and fatigue.  HENT: Negative for ear pain, sore throat, trouble swallowing, neck pain and ear discharge.   Eyes: Negative for photophobia, discharge and visual disturbance.  Respiratory: Negative for cough, choking, chest tightness, shortness of breath, wheezing and stridor.   Cardiovascular: Negative for chest pain, palpitations and leg swelling.  Gastrointestinal: Positive for nausea, vomiting, abdominal pain and diarrhea. Negative for constipation, anal bleeding and rectal pain.  Genitourinary: Negative for dysuria, frequency and difficulty urinating.    Musculoskeletal: Negative for myalgias and gait problem.  Skin: Negative for color change, pallor and rash.  Neurological: Negative for dizziness, speech difficulty, weakness and numbness.  Hematological: Negative for adenopathy.  Psychiatric/Behavioral: Negative for confusion and agitation. The patient is not nervous/anxious.        Objective:   Physical Exam  Constitutional: She is oriented to person, place, and time. She appears well-developed and well-nourished. No distress.  HENT:  Head: Normocephalic.  Mouth/Throat: Oropharynx is clear and moist. No oropharyngeal exudate.  Eyes: Conjunctivae and EOM are normal. Pupils are equal, round, and reactive to light. No scleral icterus.  Neck: Normal range of motion. Neck supple. No tracheal deviation present.  Cardiovascular: Normal rate, regular rhythm and intact distal pulses.   Pulmonary/Chest: Effort normal and breath sounds normal. No respiratory distress. She exhibits no tenderness.  Abdominal: Soft. She exhibits no distension and no mass. There is no tenderness. There is no rebound and no guarding. Hernia confirmed negative in the right inguinal area and confirmed negative in the left inguinal area.       Obese  Genitourinary: No vaginal discharge found.  Musculoskeletal: Normal range of motion. She exhibits no tenderness.  Lymphadenopathy:    She has no cervical adenopathy.       Right: No inguinal adenopathy present.       Left: No inguinal adenopathy present.  Neurological: She is alert and oriented to person, place, and time. No cranial nerve deficit. She exhibits normal muscle tone. Coordination normal.  Skin: Skin is warm and dry. No rash noted. She is not diaphoretic. No erythema.  Psychiatric: She has a normal mood and affect. Her behavior is normal. Judgment and thought content normal.       Assessment:     Symptomatic GS, prob chronic cholecystitis.  Differential Diagnosis unlikely    Plan:     I think she is a  reasonable candidate for single sided technique. She's getting symptoms almost every other day. We will try and do this soon.  The anatomy & physiology of hepatobiliary & pancreatic function was discussed.  The pathophysiology of gallbladder dysfunction was discussed.  Natural history risks without surgery was discussed.   I feel the risks of no intervention will lead to serious problems that outweigh the operative risks; therefore, I recommended cholecystectomy to remove the pathology.  I explained laparoscopic techniques with possible need for an open approach.  Probable cholangiogram to evaluate the bilary tract was explained as well.    Risks such as bleeding, infection, abscess, leak, injury to other organs, need for further treatment, heart attack, death, and other risks were discussed.  I noted a good likelihood this will help address the problem.  Possibility that this will not correct all abdominal symptoms was explained.  Goals of post-operative recovery  were discussed as well.  We will work to minimize complications.  An educational handout further explaining the pathology and treatment options was given as well.  Questions were answered.  The patient expresses understanding & wishes to proceed with surgery.

## 2011-12-09 ENCOUNTER — Telehealth: Payer: Self-pay | Admitting: *Deleted

## 2011-12-09 NOTE — Telephone Encounter (Signed)
Fax request received for [facility] for a copy of las office note and labs. Office visit from 11/18/2011 and labs from 10/28/2011 faxed to [phone].

## 2011-12-14 ENCOUNTER — Ambulatory Visit (INDEPENDENT_AMBULATORY_CARE_PROVIDER_SITE_OTHER): Payer: Self-pay | Admitting: Surgery

## 2011-12-18 ENCOUNTER — Other Ambulatory Visit (INDEPENDENT_AMBULATORY_CARE_PROVIDER_SITE_OTHER): Payer: Self-pay | Admitting: Surgery

## 2011-12-18 DIAGNOSIS — K8010 Calculus of gallbladder with chronic cholecystitis without obstruction: Secondary | ICD-10-CM

## 2011-12-18 HISTORY — PX: CHOLECYSTECTOMY: SHX55

## 2011-12-29 ENCOUNTER — Telehealth: Payer: Self-pay | Admitting: Internal Medicine

## 2011-12-29 DIAGNOSIS — I10 Essential (primary) hypertension: Secondary | ICD-10-CM

## 2011-12-29 NOTE — Telephone Encounter (Signed)
Labetalol 100 mg  [facility]  She takes 1/2 per day qty 90

## 2011-12-30 MED ORDER — LABETALOL HCL 100 MG PO TABS: ORAL_TABLET | ORAL | Status: DC

## 2011-12-30 NOTE — Telephone Encounter (Signed)
Rx refill sent to pharmacy. 

## 2012-01-13 ENCOUNTER — Encounter (INDEPENDENT_AMBULATORY_CARE_PROVIDER_SITE_OTHER): Payer: Self-pay | Admitting: Surgery

## 2012-01-13 ENCOUNTER — Ambulatory Visit (INDEPENDENT_AMBULATORY_CARE_PROVIDER_SITE_OTHER): Payer: BC Managed Care – PPO | Admitting: Surgery

## 2012-01-13 VITALS — BP 124/80 | HR 70 | Temp 97.6°F | Resp 16 | Ht 65.0 in | Wt 201.0 lb

## 2012-01-13 DIAGNOSIS — K8010 Calculus of gallbladder with chronic cholecystitis without obstruction: Secondary | ICD-10-CM

## 2012-01-13 NOTE — Progress Notes (Signed)
Subjective:     Patient ID: Virginia [name], female   DOB: 1971-09-23, 41 y.o.   MRN: [id number]  HPI  [name]  1971-06-30 [id number]  Patient Care Team: Ala Dach Letitia Libra., MD as PCP - General (Internal Medicine) Yancey Flemings, MD as Consulting Physician (Gastroenterology)  This patient is a 41 y.o.female who presents today for surgical evaluation.   Procedure: Single site laparoscopic cholecystectomy and cholangiogram 12/18/2011  Pathology: Chronic Foley cystitis and cholecystolithiasis  The patient comes in today doing pretty well. Had an episode of right lower quadrant pain one time. An episode of some soreness in the right upper side one time. However, they are not as intense. Eating most foods well. Some mild constipation. No diarrhea. Wanting to hopefully start introducing to increase her fruit/vegetable intake. Not really taking a fiber supplement. Energy level coming back. Appetite pretty good. No bed nausea, vomiting, bloating.  Soreness going down. Not needing narcotics.  Patient Active Problem List  Diagnoses  . MIGRAINE HEADACHE  . HYPERTENSION  . ALLERGIC RHINITIS  . DYSPLASIA OF CERVIX UNSPECIFIED  . WEIGHT GAIN  . Abdominal  pain, other specified site  . Chronic cholecystitis with calculus    Past Medical History  Diagnosis Date  . Migraine   . Allergy   . Hypertension   . History of chicken pox   . Plantar fasciitis   . Dysplasia 1998    Past Surgical History  Procedure Date  . Knee surgery 1986  . Wisdom tooth extraction 1999  . Cervical displesia 1998  . Cholecystectomy 12/18/11    History   Social History  . Marital Status: Married    Spouse Name: N/A    Number of Children: N/A  . Years of Education: N/A   Occupational History  . Engineer    Social History Main Topics  . Smoking status: Never Smoker   . Smokeless tobacco: Never Used  . Alcohol Use: 0.0 oz/week    2-3 Glasses of wine per week     week  . Drug Use: No  .  Sexually Active: Not on file   Other Topics Concern  . Not on file   Social History Narrative   Pt has either 2 cups soda or coffee or green tea daily.    Family History  Problem Relation Age of Onset  . Alcohol abuse Father   . Stroke Paternal Grandfather   . Lung cancer Maternal Grandfather   . Breast cancer Maternal Aunt   . Cancer Maternal Aunt     breast/bone  . Diabetes Mother   . Cancer Maternal Grandmother     lung    Current outpatient prescriptions:labetalol (NORMODYNE) 100 MG tablet, Take 1/2 tablet by mouth twice a day., Disp: 90 tablet, Rfl: 1;  omeprazole (PRILOSEC) 40 MG capsule, Take 1 capsule (40 mg total) by mouth daily., Disp: 30 capsule, Rfl: 11  No Known Allergies  BP 124/80  Pulse 70  Temp(Src) 97.6 F (36.4 C) (Temporal)  Resp 16  Ht 5\' 5"  (1.651 m)  Wt 201 lb (91.173 kg)  BMI 33.45 kg/m2     Review of Systems  Constitutional: Negative for fever, chills and diaphoresis.  HENT: Negative for ear pain, sore throat and trouble swallowing.   Eyes: Negative for photophobia and visual disturbance.  Respiratory: Negative for cough and choking.   Cardiovascular: Negative for chest pain and palpitations.  Gastrointestinal: Negative for nausea, vomiting, diarrhea, constipation, blood in stool, abdominal distention, anal bleeding and  rectal pain.  Genitourinary: Negative for dysuria, frequency and difficulty urinating.  Musculoskeletal: Negative for myalgias and gait problem.  Skin: Negative for color change, pallor and rash.  Neurological: Negative for dizziness, speech difficulty, weakness and numbness.  Hematological: Negative for adenopathy.  Psychiatric/Behavioral: Negative for confusion and agitation. The patient is not nervous/anxious.        Objective:   Physical Exam  Constitutional: She is oriented to person, place, and time. She appears well-developed and well-nourished. No distress.  HENT:  Head: Normocephalic.  Mouth/Throat:  Oropharynx is clear and moist. No oropharyngeal exudate.  Eyes: Conjunctivae and EOM are normal. Pupils are equal, round, and reactive to light. No scleral icterus.  Neck: Normal range of motion. No tracheal deviation present.  Cardiovascular: Normal rate and intact distal pulses.   Pulmonary/Chest: Effort normal. No respiratory distress. She exhibits no tenderness.  Abdominal: Soft. She exhibits no distension. There is no tenderness. Hernia confirmed negative in the right inguinal area and confirmed negative in the left inguinal area.       Incisions clean with normal healing ridges.  No hernias  Genitourinary: No vaginal discharge found.  Musculoskeletal: Normal range of motion. She exhibits no tenderness.  Lymphadenopathy:       Right: No inguinal adenopathy present.       Left: No inguinal adenopathy present.  Neurological: She is alert and oriented to person, place, and time. No cranial nerve deficit. She exhibits normal muscle tone. Coordination normal.  Skin: Skin is warm and dry. No rash noted. She is not diaphoretic.  Psychiatric: She has a normal mood and affect. Her behavior is normal.       Assessment:     3 weeks s/p chole for cholecystitis, recovering well    Plan:     Increase activity as tolerated.  Do not push through pain.  Advanced on diet as tolerated. Bowel regimen to avoid problems.  Add psyllium or similar supp every day.    Return to clinic p.r.n. The patient expressed understanding and appreciation

## 2012-01-29 ENCOUNTER — Encounter (INDEPENDENT_AMBULATORY_CARE_PROVIDER_SITE_OTHER): Payer: Self-pay | Admitting: Surgery

## 2012-02-04 ENCOUNTER — Encounter (INDEPENDENT_AMBULATORY_CARE_PROVIDER_SITE_OTHER): Payer: Self-pay | Admitting: Surgery

## 2012-03-24 LAB — OB RESULTS CONSOLE ANTIBODY SCREEN: Antibody Screen: NEGATIVE

## 2012-03-24 LAB — OB RESULTS CONSOLE GC/CHLAMYDIA
Chlamydia: NEGATIVE
Gonorrhea: NEGATIVE

## 2012-03-24 LAB — OB RESULTS CONSOLE RUBELLA ANTIBODY, IGM: Rubella: IMMUNE

## 2012-03-24 LAB — OB RESULTS CONSOLE HIV ANTIBODY (ROUTINE TESTING): HIV: NONREACTIVE

## 2012-03-24 LAB — OB RESULTS CONSOLE HEPATITIS B SURFACE ANTIGEN: Hepatitis B Surface Ag: NEGATIVE

## 2012-03-24 LAB — OB RESULTS CONSOLE RPR: RPR: NONREACTIVE

## 2012-03-25 ENCOUNTER — Ambulatory Visit (HOSPITAL_COMMUNITY)
Admission: RE | Admit: 2012-03-25 | Discharge: 2012-03-25 | Disposition: A | Discharge status: Home / Self Care | Payer: BC Managed Care – PPO | Source: Ambulatory Visit | Attending: Obstetrics and Gynecology | Admitting: Obstetrics and Gynecology

## 2012-03-25 NOTE — Progress Notes (Addendum)
Genetic Counseling  High-Risk Gestation Note  Appointment Date:  03/25/2012 Referred By: Zelphia Cairo, MD Date of Birth:  1971-04-15 Partner:  Virginia [name]     Pregnancy History: G1P0 Estimated Date of Delivery: 10/31/12 Estimated Gestational Age: [redacted]w[redacted]d Attending: Rema Fendt, MD   [name] and her husband, [name], were seen for genetic counseling because of a maternal age of 41 y.o.Marland Kitchen     They were counseled regarding maternal age and the association with risk for chromosome conditions due to nondisjunction with aging of the ova.   We reviewed chromosomes, nondisjunction, and the associated 1 in 60 risk for fetal aneuploidy related to a maternal age of 41 y.o. at [redacted]w[redacted]d gestation.  They were counseled that the risk for aneuploidy decreases as gestational age increases, accounting for those pregnancies which spontaneously abort.  We specifically discussed Down syndrome (trisomy 81), trisomies 62 and 64, and sex chromosome aneuploidies (47,XXX and 47,XXY) including the common features and prognoses of each.   We reviewed available screening and diagnostic options.  Regarding screening tests, we discussed the options of First screen, second trimester AFP screen and ultrasound.  They understand that screening tests are used to modify a patient's a priori risk for aneuploidy, typically based on age.  This estimate provides a pregnancy specific risk assessment, but does not diagnose or rule out these conditions. They were counseled that 50-80% of fetuses with Down syndrome and up to 90% of fetuses with trisomies 13 and 18, when well visualized, have detectable anomalies or soft markers by ultrasound. We discussed another type of screening test, noninvasive prenatal testing (NIPT), which utilizes cell free fetal DNA found in the maternal circulation. This test is not diagnostic for chromosome conditions, but can provide information regarding the presence or absence of extra fetal  DNA for chromosomes 13, 18 and 21. Thus, it would not identify or rule out all fetal aneuploidy. The reported detection rate is greater than 99% for Trisomy 21, greater than 97% for Trisomy 18, and is approximately 80% (8 out of 10) for Trisomy 13. The false positive rate is reported to be less than 1% for any of these conditions.  We also reviewed the availability of diagnostic options including CVS and amniocentesis.  We discussed the risks, limitations, and benefits of each.    The couple planned to further review and consider these options for the pregnancy. [name] stated that she is scheduled for first trimester screening on 04/20/12 at her OB office. However, the couple stated that they would likely pursue diagnostic testing and if so, likely prefer amniocentesis versus CVS. Mr. and [name] also planned to further consider whether or not first pursuing cell free fetal DNA testing (MaterniT21) prior to pursuing diagnostic testing would be beneficial for them. They understand that ultrasound, screening, and testing cannot rule out all birth defects or genetic syndromes. [name] planned to call our office to schedule cell free fetal DNA testing and/or diagnostic testing (CVS or amniocentesis), should the couple elect to pursue additional testing.   [name] was provided with written information regarding cystic fibrosis (CF) including the carrier frequency and incidence in the Caucasian population, the availability of carrier testing and prenatal diagnosis if indicated.  In addition, we discussed that CF is routinely screened for as part of the [address] newborn screening panel.  She declined testing today, and stated that if she elected to pursue this screening, she would prefer to have it drawn at the time of  her next scheduled blood draw at her OB office.   Both family histories were reviewed and found to be contributory for mental retardation in [name]' maternal second cousin. He had very  limited information regarding this individual, including gender. No underlying cause was known at the time of today's visit. We discussed that there are many different causes of mental retardation such as genetic differences, sporadic causes, or injuries.  A specific diagnosis for mental retardation can be determined in approximately 50% of cases.  In the remaining 50% of cases, a diagnosis may not ever be determined.  Without more specific information, potential genetic risks cannot be determined.  The patient was advised to call if she is able to find out more information regarding a specific diagnosis. However, given the degree of relation, implications for the current pregnancy are likely low.   Additionally, [name] reported a maternal second cousin, a sibling to the previously discussed individual with epilepsy. Limited information was also known regarding this individual. Epilepsy occurs in approximately 1% of the population and can have many causes.  Approximately 80% of epilepsy is thought to be idiopathic while the remaining 20% is secondary to a variety of factors such as perinatal events, infections, trauma and genetic disease.  A specific diagnosis in an affected individual is necessary to accurately assess the risk for other family members to develop epilepsy.  In the absence of a known etiology, epilepsy is thought to be caused by a combination of genetic and environmental factors, called multifactorial inheritance. In the case of multifactorial inheritance, recurrence risk for the current pregnancy would not be expected to be increased above the general population risk, given the degree of relation. Without further information regarding the provided family history, an accurate genetic risk cannot be calculated. Further genetic counseling is warranted if more information is obtained.  [name] denied exposure to environmental toxins or chemical agents. She denied the use of tobacco or street  drugs. She reported drinking alcohol prior to being aware of the pregnancy in the first 4 weeks of gestation and no exposure since that time. The all-or-none period was discussed, meaning exposures that occur in the first 4 weeks of gestation are typically thought to either not affect the pregnancy at all or result in a miscarriage. She denied significant viral illnesses during the course of her pregnancy. Her medical and surgical histories were contributory for hypertension, for which she was treated with labetalol. However, she stated that her physician discontinued this medication given that it was causing low blood pressure.    I counseled this couple regarding the above risks and available options.  The approximate face-to-face time with the genetic counselor was 55 minutes.  Quinn Plowman, MS,  Certified [employer] 03/25/2012

## 2012-03-28 LAB — OB RESULTS CONSOLE ABO/RH: RH Type: POSITIVE

## 2012-04-05 ENCOUNTER — Telehealth (HOSPITAL_COMMUNITY): Payer: Self-pay | Admitting: MS"

## 2012-04-05 ENCOUNTER — Other Ambulatory Visit (HOSPITAL_COMMUNITY): Payer: Self-pay | Admitting: Obstetrics and Gynecology

## 2012-04-05 DIAGNOSIS — O2690 Pregnancy related conditions, unspecified, unspecified trimester: Secondary | ICD-10-CM

## 2012-04-05 NOTE — Telephone Encounter (Signed)
Ms. BRAYLIN FORMBY called stating that she and her partner decided to proceed with amniocentesis in the current pregnancy at [redacted] weeks gestation and would like to schedule this procedure. We reviewed the option of cell free fetal DNA testing given that the patient was considering pursuing this testing prior to amniocentesis. She stated that they would like to forego cell free fetal DNA testing and proceed with amniocentesis. She asked if procedure related complications could occur at any point in the pregnancy. We discussed that a complication occurring >1 week post-procedure is typically not expected to be procedure related. We reviewed the potential for fusion of amnion and chorion occurring after [redacted] weeks gestation, in which case the physician may not feel comfortable proceeding with amniocentesis until the membranes are fused. After reviewing information regarding amniocentesis, the patient elected to schedule amniocentesis in our office on Tuesday, 05/10/12 at 1:00 pm. Ms. Hillebrand stated that first trimester screening was scheduled in her OB office on 5/22 and asked if she should cancel that appointment. We reviewed that an amniocentesis is considered diagnostic for chromosome conditions but first trimester screening is not, thus amniocentesis provides more information regarding chromosome conditions than first trimester screening. The patient planned to cancel her first trimester screening appointment with her OB. Ms. Frankland was encouraged to contact us with additional questions or concerns.

## 2012-05-10 ENCOUNTER — Other Ambulatory Visit (HOSPITAL_COMMUNITY): Payer: BC Managed Care – PPO

## 2012-05-12 ENCOUNTER — Other Ambulatory Visit: Payer: Self-pay

## 2012-05-12 ENCOUNTER — Encounter (HOSPITAL_COMMUNITY): Payer: Self-pay

## 2012-05-12 ENCOUNTER — Ambulatory Visit (HOSPITAL_COMMUNITY)
Admission: RE | Admit: 2012-05-12 | Discharge: 2012-05-12 | Disposition: A | Discharge status: Home / Self Care | Payer: BC Managed Care – PPO | Source: Ambulatory Visit | Attending: Obstetrics and Gynecology | Admitting: Obstetrics and Gynecology

## 2012-05-12 DIAGNOSIS — O09519 Supervision of elderly primigravida, unspecified trimester: Secondary | ICD-10-CM | POA: Insufficient documentation

## 2012-05-12 DIAGNOSIS — O10019 Pre-existing essential hypertension complicating pregnancy, unspecified trimester: Secondary | ICD-10-CM | POA: Insufficient documentation

## 2012-05-12 DIAGNOSIS — O2690 Pregnancy related conditions, unspecified, unspecified trimester: Secondary | ICD-10-CM

## 2012-05-12 LAB — AP-AFP (ALPHA FETOPROTEIN)

## 2012-05-12 LAB — ROUTINE CHROMOSOME - KARYOTYPE + FISH

## 2012-05-13 ENCOUNTER — Other Ambulatory Visit: Payer: Self-pay

## 2012-05-13 ENCOUNTER — Telehealth (HOSPITAL_COMMUNITY): Payer: Self-pay | Admitting: MS"

## 2012-05-13 NOTE — Telephone Encounter (Signed)
Called Virginia [name] to discuss the preliminary FISH results from her amniocentesis We reviewed that these are within normal limits.  We again discussed the limitations of FISH and that final results are still pending and will be available in 1-2 weeks.  Patient reported that she has not noticed any concerning symptoms or complications following the procedure.  All questions were answered to her satisfaction, she was encouraged to call with additional questions or concerns.  Quinn Plowman, MS Patent attorney

## 2012-05-13 NOTE — Telephone Encounter (Signed)
Left message for patient to return call.   Clydie Braun Givanni Staron 05/13/2012 11:43 AM

## 2012-05-20 ENCOUNTER — Other Ambulatory Visit: Payer: Self-pay

## 2012-05-20 ENCOUNTER — Telehealth (HOSPITAL_COMMUNITY): Payer: Self-pay | Admitting: MS"

## 2012-05-20 NOTE — Telephone Encounter (Signed)
Called Virginia [name] to discuss the final karyotype result from her amniocentesis We reviewed that these are within normal limits (46, XY).  We reviewed that AF-AFP study is currently pending, assessing the risk for open neural tube defects. Will contact patient once these results are available. All questions were answered to her satisfaction, she was encouraged to call with additional questions or concerns.  Quinn Plowman, MS Patent attorney

## 2012-06-01 ENCOUNTER — Telehealth (HOSPITAL_COMMUNITY): Payer: Self-pay | Admitting: MS"

## 2012-06-01 NOTE — Telephone Encounter (Signed)
Called Ms. Virginia [name] to discuss result of amniotic fluid AFP testing was within normal range with respect to open neural tube defects. Patient happy to hear information. All questions answered to her satisfaction. She was encouraged to call with additional questions or concerns.   Quinn Plowman, MS Patent attorney

## 2012-06-01 NOTE — Telephone Encounter (Signed)
Left message for patient to return call.

## 2012-06-28 IMAGING — US US ABDOMEN COMPLETE
1 series · 14 of 25 positions shown · non-contrast
Comparison: None.

CLINICAL DATA: Epigastric pain.

ABDOMINAL ULTRASOUND COMPLETE

[Series 1: us abdomen complete · 0.32mm/px · 14 of 102 slices shown]
[im 1/102]
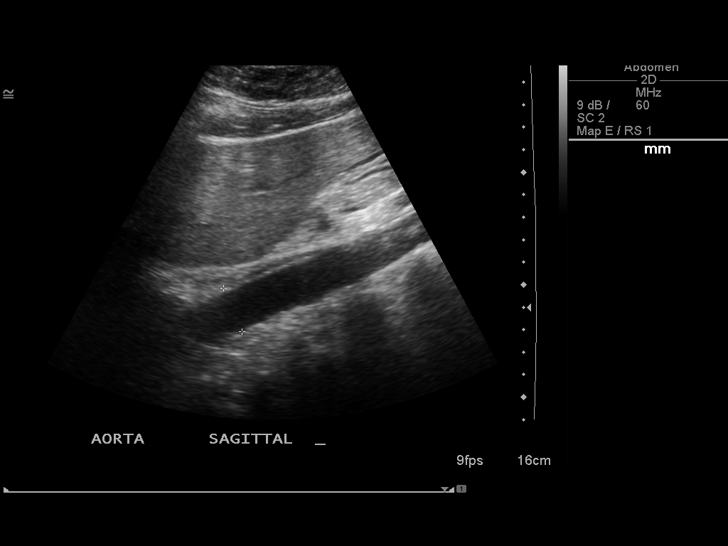
[im 9/102]
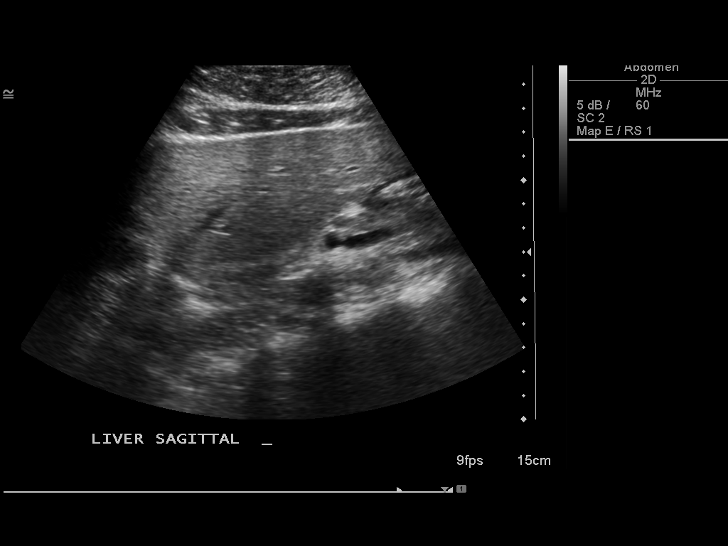
[im 17/102]
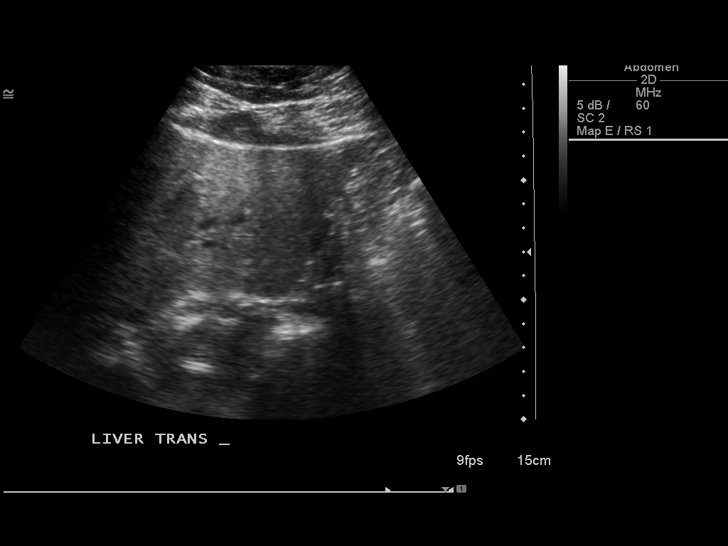
[im 26/102]
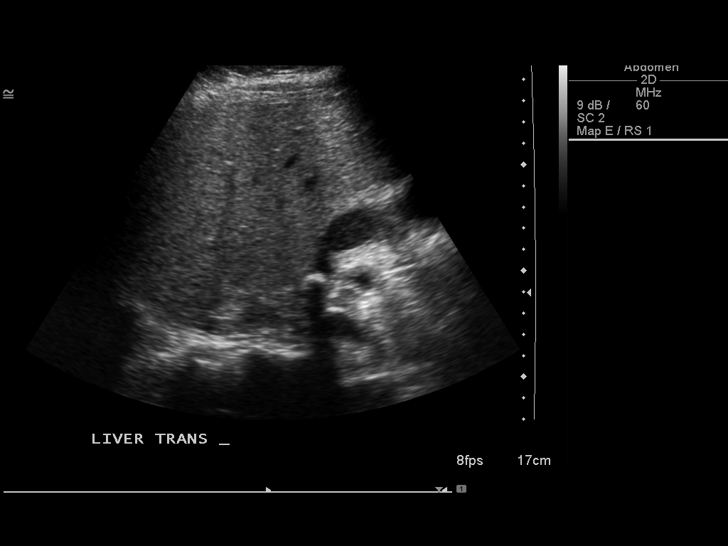
[im 34/102]
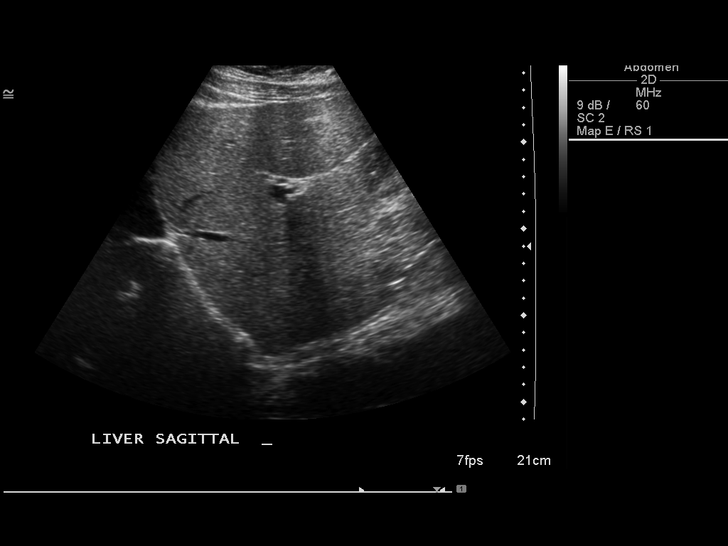
[im 38/102]
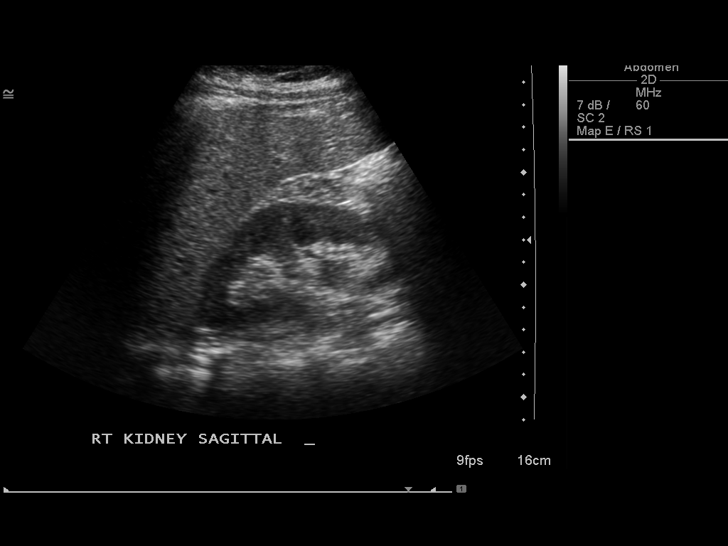
[im 47/102]
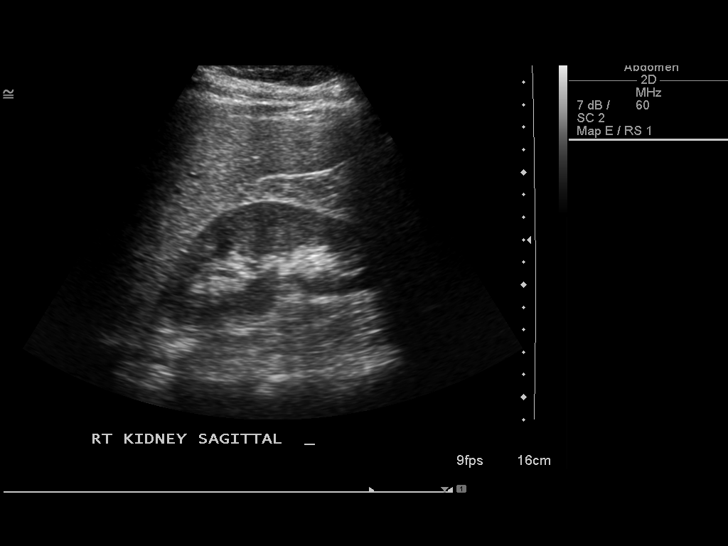
[im 55/102]
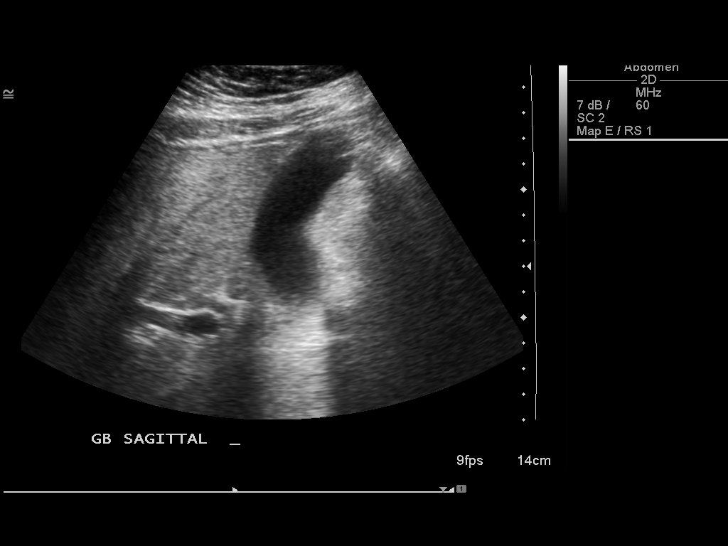
[im 64/102]
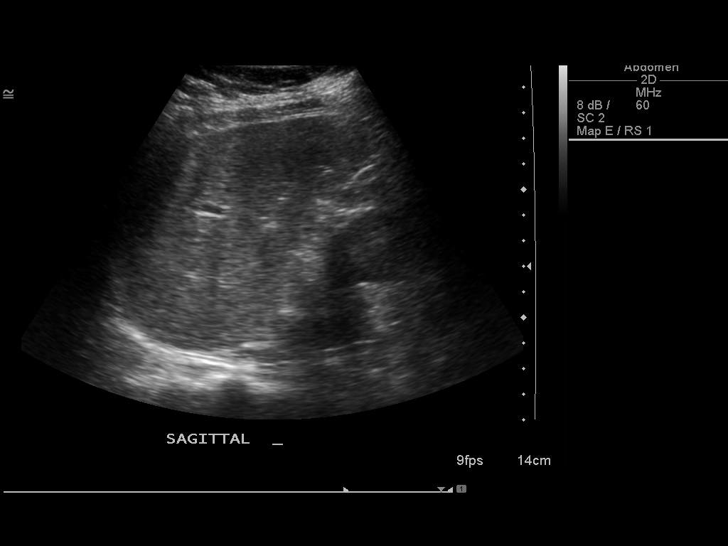
[im 68/102]
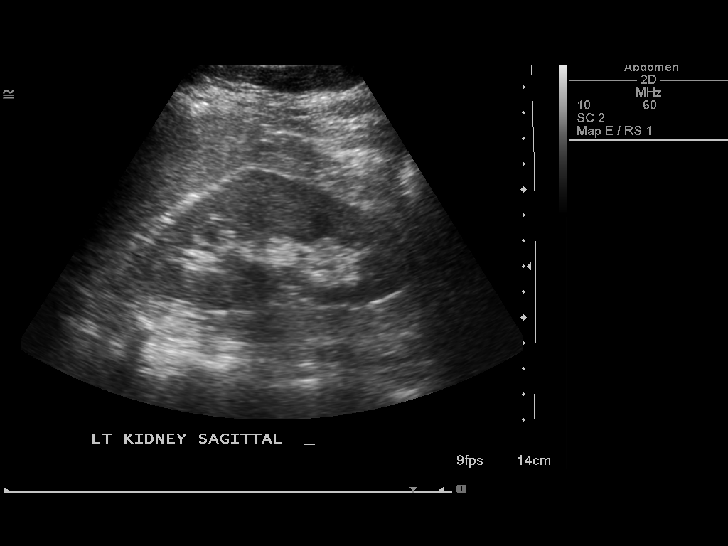
[im 76/102]
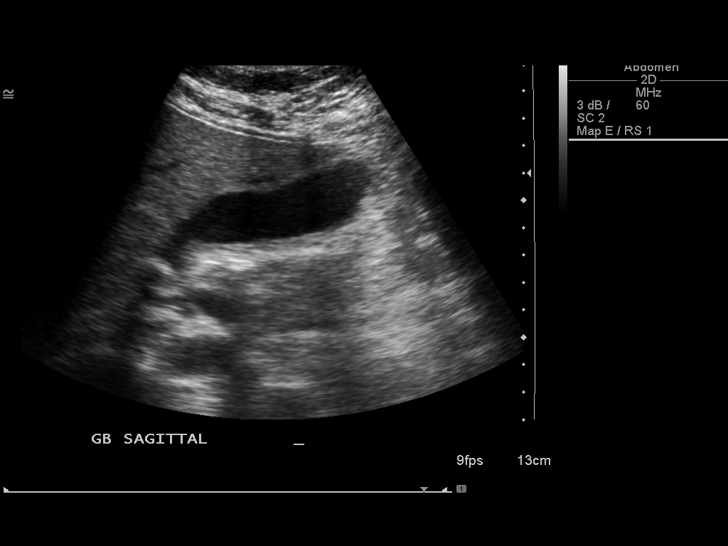
[im 85/102]
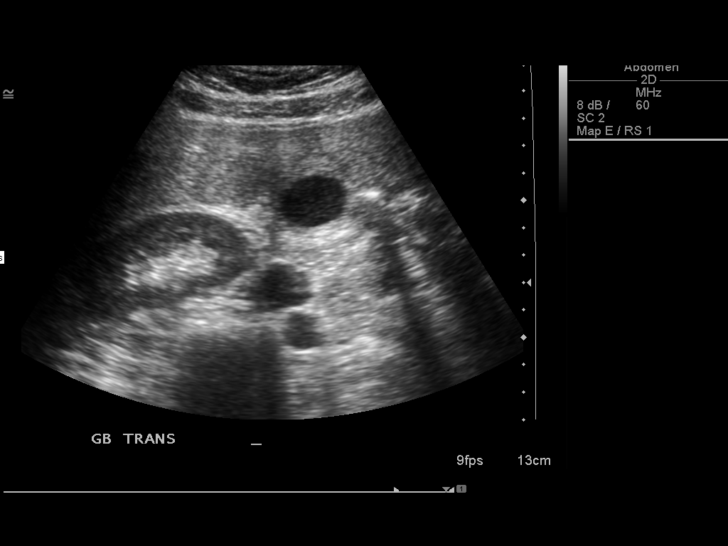
[im 93/102]
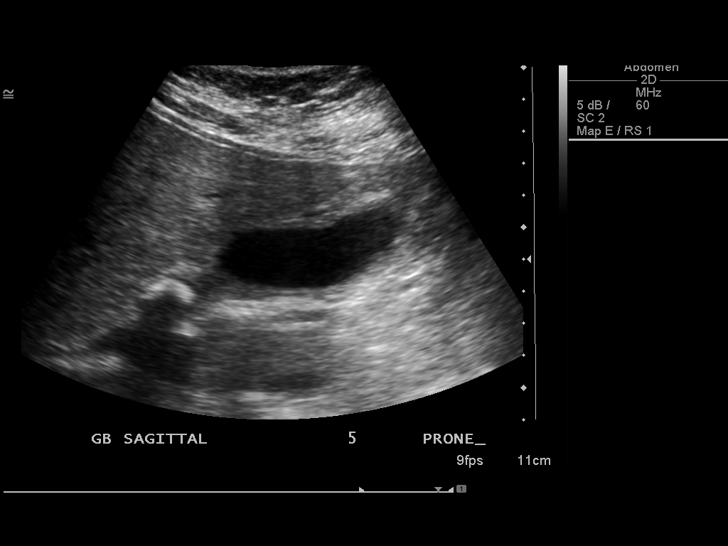
[im 102/102]
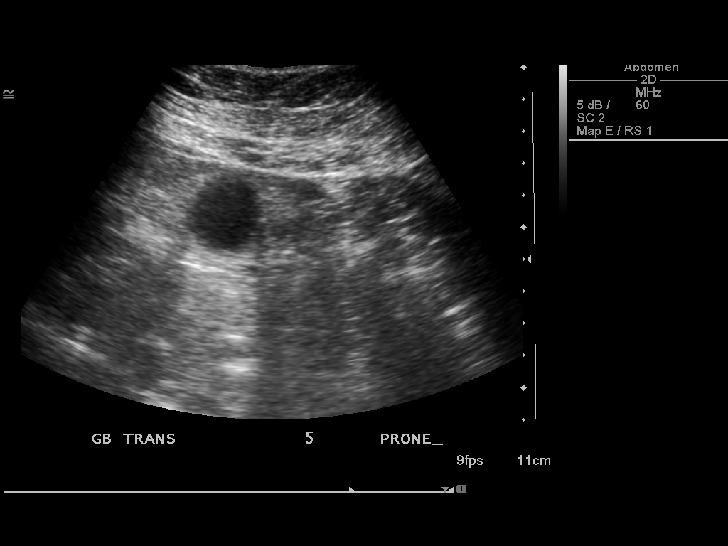

[14 of 25 positions shown; findings below may reference images not displayed]

FINDINGS: Gallbladder: There is a 1.3 cm stone at the gallbladder neck.  A
small amount of layering sludge is also present.  No  gallbladder
wall thickening or pericholecystic fluid. Negative sonographic
Murphy's sign.

Common Bile Duct:  Within normal limits in caliber. Measures
mm.

Liver: No focal mass lesion identified.  Within normal limits in
parenchymal echogenicity.

IVC:  Appears normal.

Pancreas:  No abnormality identified.

Spleen:  Within normal limits in size and echotexture.

Right kidney:  Normal in size and parenchymal echogenicity.  No
evidence of mass or hydronephrosis.

Left kidney:  Normal in size and parenchymal echogenicity.  No
evidence of mass or hydronephrosis.

Abdominal Aorta:  No aneurysm identified.
IMPRESSION: Cholelithiasis without sonographic evidence of
cholecystitis.

## 2012-10-04 LAB — OB RESULTS CONSOLE GBS: GBS: NEGATIVE

## 2012-10-22 ENCOUNTER — Encounter (HOSPITAL_COMMUNITY): Payer: Self-pay | Admitting: Family Medicine

## 2012-10-22 ENCOUNTER — Inpatient Hospital Stay (HOSPITAL_COMMUNITY)
Admission: AD | Admit: 2012-10-22 | Discharge: 2012-10-26 | DRG: 371 | Disposition: A | Discharge status: Home / Self Care | Payer: BC Managed Care – PPO | Source: Ambulatory Visit | Attending: Obstetrics and Gynecology | Admitting: Obstetrics and Gynecology

## 2012-10-22 DIAGNOSIS — I10 Essential (primary) hypertension: Secondary | ICD-10-CM

## 2012-10-22 DIAGNOSIS — G43909 Migraine, unspecified, not intractable, without status migrainosus: Secondary | ICD-10-CM

## 2012-10-22 DIAGNOSIS — N879 Dysplasia of cervix uteri, unspecified: Secondary | ICD-10-CM

## 2012-10-22 DIAGNOSIS — O09519 Supervision of elderly primigravida, unspecified trimester: Secondary | ICD-10-CM | POA: Diagnosis present

## 2012-10-22 DIAGNOSIS — J309 Allergic rhinitis, unspecified: Secondary | ICD-10-CM

## 2012-10-22 DIAGNOSIS — R109 Unspecified abdominal pain: Secondary | ICD-10-CM

## 2012-10-22 DIAGNOSIS — R635 Abnormal weight gain: Secondary | ICD-10-CM

## 2012-10-22 DIAGNOSIS — K8010 Calculus of gallbladder with chronic cholecystitis without obstruction: Secondary | ICD-10-CM

## 2012-10-22 HISTORY — DX: Other specified postprocedural states: R11.2

## 2012-10-22 HISTORY — DX: Other specified postprocedural states: Z98.890

## 2012-10-22 NOTE — MAU Note (Signed)
Pt presents with fluid running down her legs and her pants are wet-labor and delivery called-states they will take the pt directly per Dana,RN-charge nurse-pt escorted to the BR and given a gown and robe to change into then escorted to rm [id number]

## 2012-10-22 NOTE — Progress Notes (Signed)
MD notified of pt status, FHR, UC pattern, possible SROM, and that RN was unable to get a positive fern. Will continue to monitor.

## 2012-10-23 ENCOUNTER — Encounter (HOSPITAL_COMMUNITY): Payer: Self-pay | Admitting: Anesthesiology

## 2012-10-23 ENCOUNTER — Inpatient Hospital Stay (HOSPITAL_COMMUNITY): Payer: BC Managed Care – PPO | Admitting: Anesthesiology

## 2012-10-23 ENCOUNTER — Encounter (HOSPITAL_COMMUNITY)
Admission: AD | Disposition: A | Discharge status: Home / Self Care | Payer: Self-pay | Source: Ambulatory Visit | Attending: Obstetrics and Gynecology

## 2012-10-23 ENCOUNTER — Encounter (HOSPITAL_COMMUNITY): Payer: Self-pay | Admitting: Family Medicine

## 2012-10-23 LAB — CBC
HCT: 37.6 % (ref 36.0–46.0)
HCT: 34.9 % — ABNORMAL LOW (ref 36.0–46.0)
Hemoglobin: 12.9 g/dL (ref 12.0–15.0)
Hemoglobin: 11.8 g/dL — ABNORMAL LOW (ref 12.0–15.0)
MCH: 32.1 pg (ref 26.0–34.0)
MCH: 31.9 pg (ref 26.0–34.0)
MCHC: 34.3 g/dL (ref 30.0–36.0)
MCHC: 33.8 g/dL (ref 30.0–36.0)
MCV: 93.5 fL (ref 78.0–100.0)
MCV: 94.3 fL (ref 78.0–100.0)
Platelets: 217 10*3/uL (ref 150–400)
Platelets: 189 10*3/uL (ref 150–400)
RBC: 4.02 MIL/uL (ref 3.87–5.11)
RBC: 3.7 MIL/uL — ABNORMAL LOW (ref 3.87–5.11)
RDW: 14.4 % (ref 11.5–15.5)
RDW: 14.6 % (ref 11.5–15.5)
WBC: 12.6 10*3/uL — ABNORMAL HIGH (ref 4.0–10.5)
WBC: 18.6 10*3/uL — ABNORMAL HIGH (ref 4.0–10.5)

## 2012-10-23 LAB — ABO/RH: ABO/RH(D): A POS

## 2012-10-23 LAB — AMNISURE RUPTURE OF MEMBRANE (ROM) NOT AT ARMC: Amnisure ROM: POSITIVE

## 2012-10-23 LAB — TYPE AND SCREEN
ABO/RH(D): A POS
Antibody Screen: NEGATIVE

## 2012-10-23 LAB — RPR: RPR Ser Ql: NONREACTIVE

## 2012-10-23 SURGERY — Surgical Case
Anesthesia: Epidural | Site: Abdomen | Wound class: Clean Contaminated

## 2012-10-23 MED ORDER — SENNOSIDES-DOCUSATE SODIUM 8.6-50 MG PO TABS
2.0000 | ORAL_TABLET | Freq: Every day | ORAL | Status: DC
  Administered 2012-10-24 – 2012-10-25 (×2): 2 via ORAL

## 2012-10-23 MED ORDER — 0.9 % SODIUM CHLORIDE (POUR BTL) OPTIME
TOPICAL | Status: DC | PRN
  Administered 2012-10-23: 100 mL
  Administered 2012-10-23: 200 mL

## 2012-10-23 MED ORDER — MIDAZOLAM HCL 2 MG/2ML IJ SOLN: 0.5000 mg | Freq: Once | INTRAMUSCULAR | Status: DC | PRN

## 2012-10-23 MED ORDER — SODIUM CHLORIDE 0.9 % IJ SOLN: 3.0000 mL | INTRAMUSCULAR | Status: DC | PRN

## 2012-10-23 MED ORDER — WITCH HAZEL-GLYCERIN EX PADS: 1.0000 "application " | MEDICATED_PAD | CUTANEOUS | Status: DC | PRN

## 2012-10-23 MED ORDER — CEFAZOLIN SODIUM-DEXTROSE 2-3 GM-% IV SOLR
INTRAVENOUS | Status: AC
  Filled 2012-10-23: qty 50

## 2012-10-23 MED ORDER — OXYTOCIN 40 UNITS IN LACTATED RINGERS INFUSION - SIMPLE MED: 62.5000 mL/h | INTRAVENOUS | Status: AC

## 2012-10-23 MED ORDER — TETANUS-DIPHTH-ACELL PERTUSSIS 5-2.5-18.5 LF-MCG/0.5 IM SUSP: 0.5000 mL | Freq: Once | INTRAMUSCULAR | Status: DC

## 2012-10-23 MED ORDER — PROMETHAZINE HCL 25 MG/ML IJ SOLN: 6.2500 mg | INTRAMUSCULAR | Status: DC | PRN

## 2012-10-23 MED ORDER — ONDANSETRON HCL 4 MG/2ML IJ SOLN: 4.0000 mg | INTRAMUSCULAR | Status: DC | PRN

## 2012-10-23 MED ORDER — LIDOCAINE HCL (PF) 1 % IJ SOLN: 30.0000 mL | INTRAMUSCULAR | Status: DC | PRN

## 2012-10-23 MED ORDER — IBUPROFEN 600 MG PO TABS: 600.0000 mg | ORAL_TABLET | Freq: Four times a day (QID) | ORAL | Status: DC | PRN

## 2012-10-23 MED ORDER — SODIUM CHLORIDE 0.9 % IV SOLN
2.0000 g | Freq: Four times a day (QID) | INTRAVENOUS | Status: DC
  Administered 2012-10-23: 2 g via INTRAVENOUS
  Filled 2012-10-23 (×2): qty 2000

## 2012-10-23 MED ORDER — MENTHOL 3 MG MT LOZG: 1.0000 | LOZENGE | OROMUCOSAL | Status: DC | PRN

## 2012-10-23 MED ORDER — LACTATED RINGERS IV SOLN
INTRAVENOUS | Status: DC
  Administered 2012-10-24: 04:00:00 via INTRAVENOUS

## 2012-10-23 MED ORDER — OXYCODONE-ACETAMINOPHEN 5-325 MG PO TABS
1.0000 | ORAL_TABLET | ORAL | Status: DC | PRN
  Administered 2012-10-25: 2 via ORAL
  Administered 2012-10-25 (×2): 1 via ORAL
  Administered 2012-10-25: 2 via ORAL
  Administered 2012-10-25: 1 via ORAL
  Administered 2012-10-26 (×2): 2 via ORAL
  Filled 2012-10-23: qty 2
  Filled 2012-10-23: qty 1
  Filled 2012-10-23: qty 2
  Filled 2012-10-23 (×2): qty 1
  Filled 2012-10-23 (×2): qty 2

## 2012-10-23 MED ORDER — METOCLOPRAMIDE HCL 5 MG/ML IJ SOLN: 10.0000 mg | Freq: Three times a day (TID) | INTRAMUSCULAR | Status: DC | PRN

## 2012-10-23 MED ORDER — CEFAZOLIN SODIUM-DEXTROSE 2-3 GM-% IV SOLR
INTRAVENOUS | Status: DC | PRN
  Administered 2012-10-23: 2 g via INTRAVENOUS

## 2012-10-23 MED ORDER — MORPHINE SULFATE (PF) 0.5 MG/ML IJ SOLN
INTRAMUSCULAR | Status: DC | PRN
  Administered 2012-10-23: 4 mg via EPIDURAL

## 2012-10-23 MED ORDER — SIMETHICONE 80 MG PO CHEW: 80.0000 mg | CHEWABLE_TABLET | ORAL | Status: DC | PRN

## 2012-10-23 MED ORDER — OXYTOCIN 40 UNITS IN LACTATED RINGERS INFUSION - SIMPLE MED: 62.5000 mL/h | INTRAVENOUS | Status: DC

## 2012-10-23 MED ORDER — DIPHENHYDRAMINE HCL 50 MG/ML IJ SOLN: 12.5000 mg | INTRAMUSCULAR | Status: DC | PRN

## 2012-10-23 MED ORDER — OXYTOCIN 10 UNIT/ML IJ SOLN
INTRAMUSCULAR | Status: AC
  Filled 2012-10-23: qty 4

## 2012-10-23 MED ORDER — EPHEDRINE 5 MG/ML INJ
INTRAVENOUS | Status: AC
  Filled 2012-10-23: qty 10

## 2012-10-23 MED ORDER — NALBUPHINE HCL 10 MG/ML IJ SOLN
5.0000 mg | INTRAMUSCULAR | Status: DC | PRN
  Filled 2012-10-23: qty 1

## 2012-10-23 MED ORDER — FENTANYL 2.5 MCG/ML BUPIVACAINE 1/10 % EPIDURAL INFUSION (WH - ANES)
14.0000 mL/h | INTRAMUSCULAR | Status: DC
  Filled 2012-10-23: qty 125

## 2012-10-23 MED ORDER — DIBUCAINE 1 % RE OINT: 1.0000 "application " | TOPICAL_OINTMENT | RECTAL | Status: DC | PRN

## 2012-10-23 MED ORDER — KETOROLAC TROMETHAMINE 30 MG/ML IJ SOLN: 30.0000 mg | Freq: Four times a day (QID) | INTRAMUSCULAR | Status: AC | PRN

## 2012-10-23 MED ORDER — ONDANSETRON HCL 4 MG/2ML IJ SOLN: 4.0000 mg | Freq: Three times a day (TID) | INTRAMUSCULAR | Status: DC | PRN

## 2012-10-23 MED ORDER — LIDOCAINE-EPINEPHRINE (PF) 2 %-1:200000 IJ SOLN
INTRAMUSCULAR | Status: AC
  Filled 2012-10-23: qty 20

## 2012-10-23 MED ORDER — EPHEDRINE SULFATE 50 MG/ML IJ SOLN
INTRAMUSCULAR | Status: DC | PRN
  Administered 2012-10-23 (×4): 10 mg via INTRAVENOUS

## 2012-10-23 MED ORDER — SCOPOLAMINE 1 MG/3DAYS TD PT72
MEDICATED_PATCH | TRANSDERMAL | Status: AC
  Administered 2012-10-23: 1.5 mg via TRANSDERMAL
  Filled 2012-10-23: qty 1

## 2012-10-23 MED ORDER — FENTANYL CITRATE 0.05 MG/ML IJ SOLN
INTRAMUSCULAR | Status: AC
  Filled 2012-10-23: qty 2

## 2012-10-23 MED ORDER — SIMETHICONE 80 MG PO CHEW
80.0000 mg | CHEWABLE_TABLET | Freq: Three times a day (TID) | ORAL | Status: DC
  Administered 2012-10-24 – 2012-10-25 (×7): 80 mg via ORAL

## 2012-10-23 MED ORDER — LACTATED RINGERS IV SOLN
INTRAVENOUS | Status: DC
  Administered 2012-10-23: 125 mL/h via INTRAVENOUS

## 2012-10-23 MED ORDER — OXYTOCIN 10 UNIT/ML IJ SOLN
40.0000 [IU] | INTRAVENOUS | Status: DC | PRN
  Administered 2012-10-23: 40 [IU] via INTRAVENOUS

## 2012-10-23 MED ORDER — OXYTOCIN 40 UNITS IN LACTATED RINGERS INFUSION - SIMPLE MED
1.0000 m[IU]/min | INTRAVENOUS | Status: DC
  Administered 2012-10-23: 2 m[IU]/min via INTRAVENOUS
  Filled 2012-10-23: qty 1000

## 2012-10-23 MED ORDER — PHENYLEPHRINE 40 MCG/ML (10ML) SYRINGE FOR IV PUSH (FOR BLOOD PRESSURE SUPPORT): 80.0000 ug | PREFILLED_SYRINGE | INTRAVENOUS | Status: DC | PRN

## 2012-10-23 MED ORDER — ACETAMINOPHEN 325 MG PO TABS: 650.0000 mg | ORAL_TABLET | ORAL | Status: DC | PRN

## 2012-10-23 MED ORDER — OXYTOCIN BOLUS FROM INFUSION: 500.0000 mL | INTRAVENOUS | Status: DC

## 2012-10-23 MED ORDER — PHENYLEPHRINE 40 MCG/ML (10ML) SYRINGE FOR IV PUSH (FOR BLOOD PRESSURE SUPPORT)
80.0000 ug | PREFILLED_SYRINGE | INTRAVENOUS | Status: DC | PRN
  Filled 2012-10-23: qty 5

## 2012-10-23 MED ORDER — ACETAMINOPHEN 10 MG/ML IV SOLN
1000.0000 mg | Freq: Four times a day (QID) | INTRAVENOUS | Status: AC | PRN
  Filled 2012-10-23: qty 100

## 2012-10-23 MED ORDER — MORPHINE SULFATE (PF) 0.5 MG/ML IJ SOLN
INTRAMUSCULAR | Status: DC | PRN
  Administered 2012-10-23: 1 mg via INTRAVENOUS

## 2012-10-23 MED ORDER — DIPHENHYDRAMINE HCL 25 MG PO CAPS: 25.0000 mg | ORAL_CAPSULE | ORAL | Status: DC | PRN

## 2012-10-23 MED ORDER — MORPHINE SULFATE 0.5 MG/ML IJ SOLN
INTRAMUSCULAR | Status: AC
  Filled 2012-10-23: qty 10

## 2012-10-23 MED ORDER — KETOROLAC TROMETHAMINE 30 MG/ML IJ SOLN
INTRAMUSCULAR | Status: AC
  Administered 2012-10-23: 30 mg via INTRAVENOUS
  Filled 2012-10-23: qty 1

## 2012-10-23 MED ORDER — ZOLPIDEM TARTRATE 5 MG PO TABS: 5.0000 mg | ORAL_TABLET | Freq: Every evening | ORAL | Status: DC | PRN

## 2012-10-23 MED ORDER — DIPHENHYDRAMINE HCL 50 MG/ML IJ SOLN: 25.0000 mg | INTRAMUSCULAR | Status: DC | PRN

## 2012-10-23 MED ORDER — SODIUM CHLORIDE 0.9 % IV SOLN: 250.0000 mL | INTRAVENOUS | Status: DC

## 2012-10-23 MED ORDER — ONDANSETRON HCL 4 MG/2ML IJ SOLN: 4.0000 mg | Freq: Four times a day (QID) | INTRAMUSCULAR | Status: DC | PRN

## 2012-10-23 MED ORDER — OXYCODONE-ACETAMINOPHEN 5-325 MG PO TABS: 1.0000 | ORAL_TABLET | ORAL | Status: DC | PRN

## 2012-10-23 MED ORDER — LACTATED RINGERS IV SOLN: 500.0000 mL | INTRAVENOUS | Status: DC | PRN

## 2012-10-23 MED ORDER — EPHEDRINE 5 MG/ML INJ
10.0000 mg | INTRAVENOUS | Status: DC | PRN
  Filled 2012-10-23: qty 4

## 2012-10-23 MED ORDER — SODIUM BICARBONATE 8.4 % IV SOLN
INTRAVENOUS | Status: DC | PRN
  Administered 2012-10-23: 5 mL via EPIDURAL

## 2012-10-23 MED ORDER — FLEET ENEMA 7-19 GM/118ML RE ENEM: 1.0000 | ENEMA | Freq: Every day | RECTAL | Status: DC | PRN

## 2012-10-23 MED ORDER — IBUPROFEN 600 MG PO TABS
600.0000 mg | ORAL_TABLET | Freq: Four times a day (QID) | ORAL | Status: DC
  Administered 2012-10-24 – 2012-10-26 (×9): 600 mg via ORAL
  Filled 2012-10-23 (×9): qty 1

## 2012-10-23 MED ORDER — DIPHENHYDRAMINE HCL 25 MG PO CAPS: 25.0000 mg | ORAL_CAPSULE | Freq: Four times a day (QID) | ORAL | Status: DC | PRN

## 2012-10-23 MED ORDER — NALOXONE HCL 0.4 MG/ML IJ SOLN: 0.4000 mg | INTRAMUSCULAR | Status: DC | PRN

## 2012-10-23 MED ORDER — KETOROLAC TROMETHAMINE 30 MG/ML IJ SOLN
30.0000 mg | Freq: Four times a day (QID) | INTRAMUSCULAR | Status: AC | PRN
  Administered 2012-10-23: 30 mg via INTRAVENOUS

## 2012-10-23 MED ORDER — ONDANSETRON HCL 4 MG/2ML IJ SOLN
INTRAMUSCULAR | Status: AC
  Filled 2012-10-23: qty 2

## 2012-10-23 MED ORDER — PHENYLEPHRINE 40 MCG/ML (10ML) SYRINGE FOR IV PUSH (FOR BLOOD PRESSURE SUPPORT)
PREFILLED_SYRINGE | INTRAVENOUS | Status: AC
  Filled 2012-10-23: qty 10

## 2012-10-23 MED ORDER — SODIUM CHLORIDE 0.9 % IJ SOLN: 3.0000 mL | Freq: Two times a day (BID) | INTRAMUSCULAR | Status: DC

## 2012-10-23 MED ORDER — MEPERIDINE HCL 25 MG/ML IJ SOLN: 6.2500 mg | INTRAMUSCULAR | Status: DC | PRN

## 2012-10-23 MED ORDER — ONDANSETRON HCL 4 MG PO TABS: 4.0000 mg | ORAL_TABLET | ORAL | Status: DC | PRN

## 2012-10-23 MED ORDER — EPHEDRINE SULFATE 50 MG/ML IJ SOLN: INTRAMUSCULAR | Status: DC | PRN

## 2012-10-23 MED ORDER — FENTANYL 2.5 MCG/ML BUPIVACAINE 1/10 % EPIDURAL INFUSION (WH - ANES)
INTRAMUSCULAR | Status: DC | PRN
  Administered 2012-10-23: 14 mL/h via EPIDURAL

## 2012-10-23 MED ORDER — CITRIC ACID-SODIUM CITRATE 334-500 MG/5ML PO SOLN
30.0000 mL | ORAL | Status: DC | PRN
  Filled 2012-10-23: qty 15

## 2012-10-23 MED ORDER — FLEET ENEMA 7-19 GM/118ML RE ENEM: 1.0000 | ENEMA | RECTAL | Status: DC | PRN

## 2012-10-23 MED ORDER — FENTANYL CITRATE 0.05 MG/ML IJ SOLN: 25.0000 ug | INTRAMUSCULAR | Status: DC | PRN

## 2012-10-23 MED ORDER — PHENYLEPHRINE HCL 10 MG/ML IJ SOLN
INTRAMUSCULAR | Status: DC | PRN
  Administered 2012-10-23 (×5): 80 ug via INTRAVENOUS

## 2012-10-23 MED ORDER — EPHEDRINE 5 MG/ML INJ: 10.0000 mg | INTRAVENOUS | Status: DC | PRN

## 2012-10-23 MED ORDER — SCOPOLAMINE 1 MG/3DAYS TD PT72
1.0000 | MEDICATED_PATCH | Freq: Once | TRANSDERMAL | Status: DC
  Administered 2012-10-23: 1.5 mg via TRANSDERMAL

## 2012-10-23 MED ORDER — LACTATED RINGERS IV SOLN
INTRAVENOUS | Status: DC | PRN
  Administered 2012-10-23: 19:00:00 via INTRAVENOUS

## 2012-10-23 MED ORDER — LANOLIN HYDROUS EX OINT: 1.0000 "application " | TOPICAL_OINTMENT | CUTANEOUS | Status: DC | PRN

## 2012-10-23 MED ORDER — LACTATED RINGERS IV SOLN
500.0000 mL | Freq: Once | INTRAVENOUS | Status: AC
  Administered 2012-10-23: 500 mL via INTRAVENOUS

## 2012-10-23 MED ORDER — BISACODYL 10 MG RE SUPP: 10.0000 mg | Freq: Every day | RECTAL | Status: DC | PRN

## 2012-10-23 MED ORDER — ONDANSETRON HCL 4 MG/2ML IJ SOLN
INTRAMUSCULAR | Status: DC | PRN
  Administered 2012-10-23: 4 mg via INTRAVENOUS

## 2012-10-23 MED ORDER — PRENATAL MULTIVITAMIN CH
1.0000 | ORAL_TABLET | Freq: Every day | ORAL | Status: DC
  Administered 2012-10-24 – 2012-10-25 (×2): 1 via ORAL
  Filled 2012-10-23 (×2): qty 1

## 2012-10-23 MED ORDER — TERBUTALINE SULFATE 1 MG/ML IJ SOLN: 0.2500 mg | Freq: Once | INTRAMUSCULAR | Status: DC | PRN

## 2012-10-23 MED ORDER — NALOXONE HCL 1 MG/ML IJ SOLN
1.0000 ug/kg/h | INTRAVENOUS | Status: DC | PRN
  Filled 2012-10-23: qty 2

## 2012-10-23 SURGICAL SUPPLY — 32 items
CLOTH BEACON ORANGE TIMEOUT ST (SAFETY) ×2 IMPLANT
DRESSING TELFA 8X3 (GAUZE/BANDAGES/DRESSINGS) ×1 IMPLANT
DRSG COVADERM 4X10 (GAUZE/BANDAGES/DRESSINGS) ×2 IMPLANT
DURAPREP 26ML APPLICATOR (WOUND CARE) ×2 IMPLANT
ELECT REM PT RETURN 9FT ADLT (ELECTROSURGICAL) ×2
ELECTRODE REM PT RTRN 9FT ADLT (ELECTROSURGICAL) ×1 IMPLANT
EXTRACTOR VACUUM M CUP 4 TUBE (SUCTIONS) ×1 IMPLANT
GAUZE SPONGE 4X4 12PLY STRL LF (GAUZE/BANDAGES/DRESSINGS) ×2 IMPLANT
GLOVE BIO SURGEON STRL SZ7 (GLOVE) ×4 IMPLANT
GOWN PREVENTION PLUS LG XLONG (DISPOSABLE) ×4 IMPLANT
KIT ABG SYR 3ML LUER SLIP (SYRINGE) ×2 IMPLANT
NDL HYPO 25X5/8 SAFETYGLIDE (NEEDLE) ×1 IMPLANT
NEEDLE HYPO 25X5/8 SAFETYGLIDE (NEEDLE) ×2 IMPLANT
NS IRRIG 1000ML POUR BTL (IV SOLUTION) ×2 IMPLANT
PACK C SECTION WH (CUSTOM PROCEDURE TRAY) ×2 IMPLANT
PAD ABD 7.5X8 STRL (GAUZE/BANDAGES/DRESSINGS) IMPLANT
PAD OB MATERNITY 4.3X12.25 (PERSONAL CARE ITEMS) ×1 IMPLANT
SLEEVE SCD COMPRESS KNEE MED (MISCELLANEOUS) ×1 IMPLANT
STAPLER VISISTAT 35W (STAPLE) IMPLANT
STRIP CLOSURE SKIN 1/4X4 (GAUZE/BANDAGES/DRESSINGS) ×2 IMPLANT
SUT CHROMIC 0 CT 1 (SUTURE) IMPLANT
SUT CHROMIC 0 CTX 36 (SUTURE) ×6 IMPLANT
SUT CHROMIC 2 0 SH (SUTURE) IMPLANT
SUT MNCRL AB 3-0 PS2 27 (SUTURE) ×2 IMPLANT
SUT PDS AB 0 CT 36 (SUTURE) ×2 IMPLANT
SUT PLAIN 0 NONE (SUTURE) IMPLANT
SUT PLAIN 2 0 XLH (SUTURE) IMPLANT
SUT VIC AB 3-0 CT1 27 (SUTURE) ×2
SUT VIC AB 3-0 CT1 TAPERPNT 27 (SUTURE) ×1 IMPLANT
TOWEL OR 17X24 6PK STRL BLUE (TOWEL DISPOSABLE) ×4 IMPLANT
TRAY FOLEY CATH 14FR (SET/KITS/TRAYS/PACK) IMPLANT
WATER STERILE IRR 1000ML POUR (IV SOLUTION) ×2 IMPLANT

## 2012-10-23 NOTE — OR Nursing (Signed)
75 ml blood loss from fundal massage by DLWegner RN, cord blood x 2 to OR [employer]

## 2012-10-23 NOTE — Progress Notes (Signed)
Patient ID: Virginia [name], female   DOB: 06-03-1971, 41 y.o.   MRN: [id number] On 20 u of pitocin Cervix 1-2 50 % vtx -2 not much change fhr reactive no decel Start ampicillin Recheck in 2 hours

## 2012-10-23 NOTE — Progress Notes (Signed)
Patient ID: Virginia [name], female   DOB: Jun 27, 1971, 41 y.o.   MRN: [id number] Cervix unchanged despite adequate ctx fhr category one  Discussed continued pitocin vs cesarean section Patient desire cesarean Risk of cesarean section discussed.  These include:  Risk of infection;  Risk of hemorrhage that could require transfusions with the associated risk of aids or hepatitis;  Excessive bleeding could require hysterectomy;  Risk of injury to adjacent organs including bladder, bowel or ureters;  Risk of DVT's and possible pulmonary embolus.  Patient expresses a understanding of indications and risks.;

## 2012-10-23 NOTE — Brief Op Note (Signed)
10/22/2012 - 10/23/2012  8:06 PM  PATIENT:  Virginia [name]  41 y.o. female  PRE-OPERATIVE DIAGNOSIS:  failure to progress  POST-OPERATIVE DIAGNOSIS:  failure to progress  PROCEDURE:  Procedure(s) (LRB) with comments: CESAREAN SECTION (N/A) - Primary cesarean section of baby boy  at 1  APGAR 9/9  SURGEON:  Surgeon(s) and Role:    * Juluis Mire, MD - Primary  PHYSICIAN ASSISTANT:   ASSISTANTS: none   ANESTHESIA:   epidural  EBL:  Total I/O In: 500 [I.V.:500] Out: 1000 [Urine:300; Blood:700]  BLOOD ADMINISTERED:none  DRAINS: Urinary Catheter (Foley)   LOCAL MEDICATIONS USED:  NONE  SPECIMEN:  No Specimen  DISPOSITION OF SPECIMEN:  N/A  COUNTS:  YES  TOURNIQUET:  * No tourniquets in log *  DICTATION: .Other Dictation: Dictation Number [id number]  PLAN OF CARE: Admit to inpatient   PATIENT DISPOSITION:  PACU - hemodynamically stable.   Delay start of Pharmacological VTE agent (>24hrs) due to surgical blood loss or risk of bleeding: not applicable

## 2012-10-23 NOTE — Anesthesia Procedure Notes (Signed)

## 2012-10-23 NOTE — H&P (Signed)
Virginia [name] is a 41 y.o. female presenting at 24.6 with spontaneous rupture of membranes.  Advanced maternal age amnio with normal genetics.  Negative group B stept. Maternal Medical History:  Reason for admission: Reason for admission: rupture of membranes.  Contractions: Onset was 6-12 hours ago.   Frequency: irregular.   Perceived severity is mild.    Fetal activity: Perceived fetal activity is normal.    Prenatal Complications - Diabetes: none.    OB History    Grav Para Term Preterm Abortions TAB SAB Ect Mult Living   1              Past Medical History  Diagnosis Date  . Migraine   . Allergy   . Hypertension   . History of chicken pox   . Plantar fasciitis   . Dysplasia 1998  . PONV (postoperative nausea and vomiting)    Past Surgical History  Procedure Date  . Knee surgery 1986  . Wisdom tooth extraction 1999  . Cervical displesia 1998  . Cholecystectomy 12/18/11   Family History: family history includes Alcohol abuse in her father; Breast cancer in her maternal aunt; Cancer in her maternal aunt and maternal grandmother; Diabetes in her mother; Lung cancer in her maternal grandfather; and Stroke in her paternal grandfather. Social History:  reports that she has never smoked. She has never used smokeless tobacco. She reports that she does not drink alcohol or use illicit drugs.   Prenatal Transfer Tool  Maternal Diabetes: No Genetic Screening: Normal Maternal Ultrasounds/Referrals: Normal Fetal Ultrasounds or other Referrals:  None Maternal Substance Abuse:  No Significant Maternal Medications:  None Significant Maternal Lab Results:  Lab values include: Group B Strep negative Other Comments:  None  ROS  Dilation: 1 Effacement (%): Thick Station: -3 Exam by:: T.Sprague RN Blood pressure 117/65, pulse 61, temperature 98 F (36.7 C), temperature source Oral, resp. rate 18, height 5\' 5"  (1.651 m), weight 103.42 kg (228 lb), last menstrual period  11/18/2011. Maternal Exam:  Uterine Assessment: Contraction strength is mild.  Contraction frequency is irregular.   Abdomen: Patient reports no abdominal tenderness. Fundal height is c/wdates.   Estimated fetal weight is 8.5.   Fetal presentation: vertex  Introitus: Amniotic fluid character: clear.  Cervix: Cervix evaluated by digital exam.   Cervix 1 cm 50% vtx at -1 spontaneous rupture of membranes clear  Physical Exam  Prenatal labs: ABO, Rh: --/--/A POS, A POS (11/24 0109) Antibody: NEG (11/24 0109) Rubella: Immune (04/25 0000) RPR: Nonreactive (04/25 0000)  HBsAg: Negative (04/25 0000)  HIV: Non-reactive (04/25 0000)  GBS: Negative (11/05 0000)   Assessment/Plan: Pregnancy at 38.6 with rupture of membranes.  AMA Risk of pitocin discussed   Virginia [name] S 10/23/2012, 7:18 AM

## 2012-10-23 NOTE — Op Note (Signed)
Patient name  Virginia [name], Virginia [name] DICTATION#  [id number] CSN# [id number]  Juluis Mire, MD 10/23/2012 8:07 PM

## 2012-10-23 NOTE — Transfer of Care (Signed)
Immediate Anesthesia Transfer of Care Note  Patient: Virginia [name]  Procedure(s) Performed: Procedure(s) (LRB) with comments: CESAREAN SECTION (N/A) - Primary cesarean section of baby boy  at 21  APGAR 9/9  Patient Location: PACU  Anesthesia Type:Epidural  Level of Consciousness: awake, alert  and oriented  Airway & Oxygen Therapy: Patient Spontanous Breathing  Post-op Assessment: Report given to PACU RN and Post -op Vital signs reviewed and stable  Post vital signs: stable  Complications: No apparent anesthesia complications

## 2012-10-23 NOTE — Anesthesia Postprocedure Evaluation (Signed)
Anesthesia Post Note  Patient: Virginia [name]  Procedure(s) Performed: Procedure(s) (LRB): CESAREAN SECTION (N/A)  Anesthesia type: Spinal  Patient location: PACU  Post pain: Pain level controlled  Post assessment: Post-op Vital signs reviewed  Last Vitals:  Filed Vitals:   10/23/12 2115  BP: 102/64  Pulse: 80  Temp:   Resp:     Post vital signs: Reviewed  Level of consciousness: awake  Complications: No apparent anesthesia complications

## 2012-10-23 NOTE — Anesthesia Preprocedure Evaluation (Signed)
Anesthesia Evaluation  Patient identified by MRN, date of birth, ID band Patient awake    Reviewed: Allergy & Precautions, H&P , Patient's Chart, lab work & pertinent test results  Airway Mallampati: III TM Distance: >3 FB Neck ROM: full    Dental  (+) Teeth Intact   Pulmonary  breath sounds clear to auscultation        Cardiovascular hypertension, On Medications and On Home Beta Blockers Rhythm:regular Rate:Normal     Neuro/Psych    GI/Hepatic   Endo/Other  Morbid obesity  Renal/GU      Musculoskeletal   Abdominal   Peds  Hematology   Anesthesia Other Findings       Reproductive/Obstetrics (+) Pregnancy                           Anesthesia Physical Anesthesia Plan  ASA: III  Anesthesia Plan: Epidural   Post-op Pain Management:    Induction:   Airway Management Planned:   Additional Equipment:   Intra-op Plan:   Post-operative Plan:   Informed Consent: I have reviewed the patients History and Physical, chart, labs and discussed the procedure including the risks, benefits and alternatives for the proposed anesthesia with the patient or authorized representative who has indicated his/her understanding and acceptance.   Dental Advisory Given  Plan Discussed with:   Anesthesia Plan Comments: (Labs checked- platelets confirmed with RN in room. Fetal heart tracing, per RN, reported to be stable enough for sitting procedure. Discussed epidural, and patient consents to the procedure:  included risk of possible headache,backache, failed block, allergic reaction, and nerve injury. This patient was asked if she had any questions or concerns before the procedure started. )        Anesthesia Quick Evaluation

## 2012-10-24 ENCOUNTER — Encounter (HOSPITAL_COMMUNITY): Payer: Self-pay | Admitting: Obstetrics and Gynecology

## 2012-10-24 LAB — CBC
HCT: 32.7 % — ABNORMAL LOW (ref 36.0–46.0)
Hemoglobin: 10.8 g/dL — ABNORMAL LOW (ref 12.0–15.0)
MCH: 31.4 pg (ref 26.0–34.0)
MCHC: 33 g/dL (ref 30.0–36.0)
MCV: 95.1 fL (ref 78.0–100.0)
Platelets: 186 10*3/uL (ref 150–400)
RBC: 3.44 MIL/uL — ABNORMAL LOW (ref 3.87–5.11)
RDW: 14.8 % (ref 11.5–15.5)
WBC: 15.3 10*3/uL — ABNORMAL HIGH (ref 4.0–10.5)

## 2012-10-24 NOTE — Progress Notes (Signed)
Subjective: Postpartum Day 1: Cesarean Delivery Patient reports tolerating PO.    Objective: Vital signs in last 24 hours: Temp:  [97.6 F (36.4 C)-98.6 F (37 C)] 98.1 F (36.7 C) (11/25 0530) Pulse Rate:  [57-96] 88  (11/25 0530) Resp:  [16-20] 18  (11/25 0530) BP: (75-136)/(45-88) 93/46 mmHg (11/25 0530) SpO2:  [95 %-100 %] 99 % (11/25 0530)  Physical Exam:  General: alert, cooperative and appears stated age Lochia: appropriate Uterine Fundus: firm Incision: dressing c/d DVT Evaluation: No evidence of DVT seen on physical exam. Negative Homan's sign. No cords or calf tenderness.   Basename 10/24/12 0600 10/23/12 2045  HGB 10.8* 11.8*  HCT 32.7* 34.9*    Assessment/Plan: Status post Cesarean section. Doing well postoperatively.  Continue current care.  Arvetta Araque 10/24/2012, 8:25 AM

## 2012-10-24 NOTE — Progress Notes (Signed)
Subjective: Postpartum Day 1: Cesarean Delivery Patient reports tolerating PO.    Objective: Vital signs in last 24 hours: Temp:  [97.6 F (36.4 C)-98.6 F (37 C)] 98.1 F (36.7 C) (11/25 0530) Pulse Rate:  [57-96] 88  (11/25 0530) Resp:  [16-20] 18  (11/25 0530) BP: (75-136)/(45-88) 93/46 mmHg (11/25 0530) SpO2:  [95 %-100 %] 99 % (11/25 0530)  Physical Exam:  General: alert and cooperative Lochia: appropriate Uterine Fundus: firm Incision: abd dressing CDI DVT Evaluation: No evidence of DVT seen on physical exam. No cords or calf tenderness. No significant calf/ankle edema.   Basename 10/24/12 0600 10/23/12 2045  HGB 10.8* 11.8*  HCT 32.7* 34.9*    Assessment/Plan: Status post Cesarean section. Doing well postoperatively.  Continue current care.  Riddik Senna G 10/24/2012, 8:26 AM

## 2012-10-24 NOTE — Anesthesia Postprocedure Evaluation (Signed)
  Anesthesia Post-op Note  Patient: Virginia [name]  Procedure(s) Performed: Procedure(s) (LRB) with comments: CESAREAN SECTION (N/A) - Primary cesarean section of baby boy  at 37  APGAR 9/9  Patient Location: PACU and Mother/Baby  Anesthesia Type:Epidural  Level of Consciousness: awake, alert  and oriented  Airway and Oxygen Therapy: Patient Spontanous Breathing  Post-op Pain: mild  Post-op Assessment: Patient's Cardiovascular Status Stable, Respiratory Function Stable, No signs of Nausea or vomiting, Adequate PO intake and Pain level controlled  Post-op Vital Signs: stable  Complications: No apparent anesthesia complications

## 2012-10-24 NOTE — Addendum Note (Signed)
Addendum  created 10/24/12 3244 by Elbert Ewings, CRNA   Modules edited:Notes Section

## 2012-10-24 NOTE — Op Note (Signed)
NAME:  DAIVA, SINER                 ACCOUNT NO.:  [id number]  MEDICAL RECORD NO.:  [id number]  LOCATION:  9148                          FACILITY:  [facility]  PHYSICIAN:  Juluis Mire, M.D.   DATE OF BIRTH:  1971-09-21  DATE OF PROCEDURE:  10/23/2012 DATE OF DISCHARGE:                              OPERATIVE REPORT   PREOPERATIVE DIAGNOSIS:  Intrauterine pregnancy at term with failure to progress.  POSTOPERATIVE DIAGNOSIS:  Intrauterine pregnancy at term with failure to progress.  OPERATIVE PROCEDURE:  Low transverse cesarean section.  SURGEON:  Juluis Mire, MD  ANESTHESIA:  Epidural.  ESTIMATED BLOOD LOSS:  400-500 mL.  PACKS:  None.  DRAINS:  Urethral Foley.  INTRAOPERATIVE BLOOD PLACEMENT:  None.  COMPLICATIONS:  None.  INDICATION:  A 41 year old primigravida female presented on Saturday afternoon with spontaneous rupture of membranes.  She was begun up to 20 units of Pitocin.  We had an adequate uterine activity.  Despite this, the cervix failed to change.  This was over a long period of time.  We offered her further attempts at continued induction or augmentation versus primary cesarean section.  The patient favored the latter for which she was brought back at the present time.  The risks were discussed including the risk of infection.  Risk of hemorrhage could require transfusion with risk of AIDS and hepatitis.  Risk of injury to adjacent organs including bladder, bowel, ureters could require further exploratory surgery.  Risk of deep venous thrombosis and pulmonary embolus.  The patient expressed understanding of indications and risks.  PROCEDURE:  The patient was taken to the OR and placed in supine position with left lateral tilt.  After satisfactory level of epidural anesthesia had been obtained, the abdomen was prepped out with Betadine and draped in sterile field.  A low-transverse skin incision was made with a knife and carried through subcutaneous tissue.   The anterior rectus fascia was entered sharply and the incision to the fascia was done laterally.  The fascia was taken off the muscle superiorly and inferiorly.  Rectus muscles were separated in the midline.  Anterior perineum was entered sharply.  Incision of the perineum extended both superiorly and inferiorly.  A  bladder flap was developed.  A low transverse uterine incision was begun with a knife and extended laterally using manual traction.  The infant was in the vertex presentation, was delivered with elevation of head and use of the vacuum extractor and fundal pressure.  The infant was a viable female.  Apgars were 8 and 9.  Weight is pending.  Placenta was delivered manually. Uterus was exteriorized for closure.  Uterus was closed in a running locking suture of 0 chromic using a two-layer closure technique.  We had good hemostasis.  Tubes and ovaries were unremarkable.  The uterus was returned to abdominal cavity.  Urine output was clear.  We irrigated the pelvis and again had good hemostasis.  Muscles and peritoneum  closed with a running suture of 3-0 Vicryl.  Fascia was closed with a running suture of 0 PDS.  Skin was closed in running subcuticular 3-0 Monocryl and Steri-Strips.  Sponge, instrument, and needle count was  reported as correct by circulating nurse x2.  Foley catheter remained clear at the time of closure.  The patient tolerated the procedure well, returned to recovery room in good condition.  Inpatient and wonders 90.     Juluis Mire, M.D.     JSM/MEDQ  D:  10/23/2012  T:  10/24/2012  Job:  [id number]

## 2012-10-25 NOTE — Progress Notes (Signed)
Subjective: Postpartum Day 2: Cesarean Delivery Patient reports tolerating PO and no problems voiding.    Objective: Vital signs in last 24 hours: Temp:  [97.5 F (36.4 C)-98.1 F (36.7 C)] 98 F (36.7 C) (11/26 0533) Pulse Rate:  [60-69] 62  (11/26 0533) Resp:  [16-18] 18  (11/26 0533) BP: (90-117)/(47-75) 117/75 mmHg (11/26 0533) SpO2:  [95 %-98 %] 98 % (11/25 1725)  Physical Exam:  General: alert and cooperative Lochia: appropriate Uterine Fundus: firm Incision: healing well DVT Evaluation: No evidence of DVT seen on physical exam. No significant calf/ankle edema.   Basename 10/24/12 0600 10/23/12 2045  HGB 10.8* 11.8*  HCT 32.7* 34.9*    Assessment/Plan: Status post Cesarean section. Doing well postoperatively.  Continue current care.  Kielee Care G 10/25/2012, 8:27 AM

## 2012-10-26 MED ORDER — IBUPROFEN 600 MG PO TABS: 600.0000 mg | ORAL_TABLET | Freq: Four times a day (QID) | ORAL | Status: DC

## 2012-10-26 MED ORDER — OXYCODONE-ACETAMINOPHEN 5-325 MG PO TABS: 1.0000 | ORAL_TABLET | ORAL | Status: DC | PRN

## 2012-10-26 NOTE — Discharge Summary (Signed)
Obstetric Discharge Summary Reason for Admission: rupture of membranes Prenatal Procedures: ultrasound Intrapartum Procedures: cesarean: low cervical, transverse Postpartum Procedures: none Complications-Operative and Postpartum: none Hemoglobin  Date Value Range Status  10/24/2012 10.8* 12.0 - 15.0 g/dL Final     HCT  Date Value Range Status  10/24/2012 32.7* 36.0 - 46.0 % Final    Physical Exam:  General: alert and cooperative Lochia: appropriate Uterine Fundus: firm Incision: healing well DVT Evaluation: No evidence of DVT seen on physical exam. Calf/Ankle edema is present.  Discharge Diagnoses: Term Pregnancy-delivered  Discharge Information: Date: 10/26/2012 Activity: pelvic rest Diet: routine Medications: None, Ibuprofen and Percocet Condition: stable Instructions: refer to practice specific booklet Discharge to: home   Newborn Data: Live born female  Birth Weight: 8 lb 10.5 oz (3925 g) APGAR: 8, 9  Home with mother.  Lameeka Schleifer G 10/26/2012, 8:46 AM

## 2012-10-26 NOTE — Progress Notes (Signed)
Pt discharged home with instructions, pt verbalized understanding, pt/baby-WNL, FOB at pt's side.

## 2013-01-27 ENCOUNTER — Encounter: Payer: Self-pay | Admitting: Internal Medicine

## 2013-01-27 ENCOUNTER — Ambulatory Visit (INDEPENDENT_AMBULATORY_CARE_PROVIDER_SITE_OTHER): Payer: BC Managed Care – PPO | Admitting: Internal Medicine

## 2013-01-27 VITALS — BP 102/62 | HR 77 | Temp 97.5°F | Ht 65.0 in | Wt 188.4 lb

## 2013-01-27 DIAGNOSIS — A088 Other specified intestinal infections: Secondary | ICD-10-CM

## 2013-01-27 DIAGNOSIS — A084 Viral intestinal infection, unspecified: Secondary | ICD-10-CM

## 2013-01-27 NOTE — Patient Instructions (Signed)
Viral Gastroenteritis Viral gastroenteritis is also known as stomach flu. This condition affects the stomach and intestinal tract. It can cause sudden diarrhea and vomiting. The illness typically lasts 3 to 8 days. Most people develop an immune response that eventually gets rid of the virus. While this natural response develops, the virus can make you quite ill. CAUSES  Many different viruses can cause gastroenteritis, such as rotavirus or noroviruses. You can catch one of these viruses by consuming contaminated food or water. You may also catch a virus by sharing utensils or other personal items with an infected person or by touching a contaminated surface. SYMPTOMS  The most common symptoms are diarrhea and vomiting. These problems can cause a severe loss of body fluids (dehydration) and a body salt (electrolyte) imbalance. Other symptoms may include:  Fever.  Headache.  Fatigue.  Abdominal pain. DIAGNOSIS  Your caregiver can usually diagnose viral gastroenteritis based on your symptoms and a physical exam. A stool sample may also be taken to test for the presence of viruses or other infections. TREATMENT  This illness typically goes away on its own. Treatments are aimed at rehydration. The most serious cases of viral gastroenteritis involve vomiting so severely that you are not able to keep fluids down. In these cases, fluids must be given through an intravenous line (IV). HOME CARE INSTRUCTIONS   Drink enough fluids to keep your urine clear or pale yellow. Drink small amounts of fluids frequently and increase the amounts as tolerated.  Ask your caregiver for specific rehydration instructions.  Avoid:  Foods high in sugar.  Alcohol.  Carbonated drinks.  Tobacco.  Juice.  Caffeine drinks.  Extremely hot or cold fluids.  Fatty, greasy foods.  Too much intake of anything at one time.  Dairy products until 24 to 48 hours after diarrhea stops.  You may consume probiotics.  Probiotics are active cultures of beneficial bacteria. They may lessen the amount and number of diarrheal stools in adults. Probiotics can be found in yogurt with active cultures and in supplements.  Wash your hands well to avoid spreading the virus.  Only take over-the-counter or prescription medicines for pain, discomfort, or fever as directed by your caregiver. Do not give aspirin to children. Antidiarrheal medicines are not recommended.  Ask your caregiver if you should continue to take your regular prescribed and over-the-counter medicines.  Keep all follow-up appointments as directed by your caregiver. SEEK IMMEDIATE MEDICAL CARE IF:   You are unable to keep fluids down.  You do not urinate at least once every 6 to 8 hours.  You develop shortness of breath.  You notice blood in your stool or vomit. This may look like coffee grounds.  You have abdominal pain that increases or is concentrated in one small area (localized).  You have persistent vomiting or diarrhea.  You have a fever.  The patient is a child younger than 3 months, and he or she has a fever.  The patient is a child older than 3 months, and he or she has a fever and persistent symptoms.  The patient is a child older than 3 months, and he or she has a fever and symptoms suddenly get worse.  The patient is a baby, and he or she has no tears when crying. MAKE SURE YOU:   Understand these instructions.  Will watch your condition.  Will get help right away if you are not doing well or get worse. Document Released: 11/16/2005 Document Revised: 02/08/2012 Document Reviewed: 09/02/2011   [employer] Patient Information 2013 [employer]  

## 2013-01-27 NOTE — Progress Notes (Signed)
Subjective:    Patient ID: Virginia [name], female    DOB: 1971/04/13, 42 y.o.   MRN: [id number]  HPI  Pt presents to the clinic today with c/o nausea, vomiting and diarrhea for the past day. This illness hit her hard yesterday. She has not had any vomiting since last night. She continues to have diarrhea but not as bad. She has been able to keep fluids down. Her husband has since started having similar symptoms. She is wondering what she can take for the diarrhea since she is breastfeeding.  Review of Systems      Past Medical History  Diagnosis Date  . Migraine   . Allergy   . Hypertension   . History of chicken pox   . Plantar fasciitis   . Dysplasia 1998  . PONV (postoperative nausea and vomiting)     Current Outpatient Prescriptions  Medication Sig Dispense Refill  . Prenatal Vit-Fe Fumarate-FA (PRENATAL MULTIVITAMIN) TABS Take 1 tablet by mouth daily.       No current facility-administered medications for this visit.    No Known Allergies  Family History  Problem Relation Age of Onset  . Alcohol abuse Father   . Stroke Paternal Grandfather   . Lung cancer Maternal Grandfather   . Breast cancer Maternal Aunt   . Cancer Maternal Aunt     breast/bone  . Diabetes Mother   . Cancer Maternal Grandmother     lung    History   Social History  . Marital Status: Married    Spouse Name: N/A    Number of Children: N/A  . Years of Education: N/A   Occupational History  . Engineer    Social History Main Topics  . Smoking status: Never Smoker   . Smokeless tobacco: Never Used  . Alcohol Use: No     Comment: week  . Drug Use: No  . Sexually Active: Not Currently   Other Topics Concern  . Not on file   Social History Narrative   Pt has either 2 cups soda or coffee or green tea daily.     Constitutional: Pt reports malaise. Denies fever, fatigue, headache or abrupt weight changes.  HEENT: Denies eye pain, eye redness, ear pain, ringing in the ears, wax  buildup, runny nose, nasal congestion, bloody nose, or sore throat. Respiratory: Denies difficulty breathing, shortness of breath, cough or sputum production.    Gastrointestinal: Pt reports nausea, vomiting and diarrhea. Denies abdominal pain, bloating, constipation, diarrhea or blood in the stool.    No other specific complaints in a complete review of systems (except as listed in HPI above).  Objective:   Physical Exam  BP 102/62  Pulse 77  Temp(Src) 97.5 F (36.4 C) (Oral)  Ht 5\' 5"  (1.651 m)  Wt 188 lb 6.4 oz (85.458 kg)  BMI 31.35 kg/m2  SpO2 95%  Breastfeeding? Yes Wt Readings from Last 3 Encounters:  01/27/13 188 lb 6.4 oz (85.458 kg)  10/22/12 228 lb (103.42 kg)  10/22/12 228 lb (103.42 kg)    General: Appears her stated age, well developed, well nourished in NAD. Skin: Warm, dry and intact. No rashes, lesions or ulcerations noted. HEENT: Head: normal shape and size; Eyes: sclera white, no icterus, conjunctiva pink, PERRLA and EOMs intact; Ears: Tm's gray and intact, normal light reflex; Nose: mucosa pink and moist, septum midline; Throat/Mouth: Teeth present, mucosa pink and moist, no exudate, lesions or ulcerations noted.  Cardiovascular: Normal rate and rhythm. S1,S2 noted.  No  murmur, rubs or gallops noted. No JVD or BLE edema. No carotid bruits noted. Pulmonary/Chest: Normal effort and positive vesicular breath sounds. No respiratory distress. No wheezes, rales or ronchi noted.  Abdomen: Soft and nontender. Normal bowel sounds, no bruits noted. No distention or masses noted. Liver, spleen and kidneys non palpable.        Assessment & Plan:   Viral Gastroenteritis, new onset, no additional workup required:  Get some rest and try to stay well hydrated I would not take any medication at this time to allow the virus to leave your body Wash and thoroughly and spray all toilet handles and doorknobs with lysol  RTC if symptoms get worse or if you are not able to  hold any fluids down.

## 2014-10-01 ENCOUNTER — Encounter: Payer: Self-pay | Admitting: Internal Medicine

## 2015-01-02 ENCOUNTER — Ambulatory Visit (INDEPENDENT_AMBULATORY_CARE_PROVIDER_SITE_OTHER): Payer: PRIVATE HEALTH INSURANCE | Admitting: Family

## 2015-01-02 ENCOUNTER — Encounter: Payer: Self-pay | Admitting: Family

## 2015-01-02 VITALS — BP 162/100 | HR 66 | Temp 97.9°F | Resp 18 | Ht 65.0 in | Wt 209.1 lb

## 2015-01-02 DIAGNOSIS — M79673 Pain in unspecified foot: Secondary | ICD-10-CM | POA: Insufficient documentation

## 2015-01-02 DIAGNOSIS — M79671 Pain in right foot: Secondary | ICD-10-CM

## 2015-01-02 NOTE — Patient Instructions (Signed)
Thank you for choosing [facility].  Summary/Instructions:  Continue OTC ibuprofen 200-800 mg as needed  Ice 2-3 times per day for about 20 minutes.   Your prescription(s) have been submitted to your pharmacy or been printed and provided for you. Please take as directed and contact our office if you believe you are having problem(s) with the medication(s) or have any questions.  If your symptoms worsen or fail to improve, please contact our office for further instruction, or in case of emergency go directly to the emergency room at the closest medical facility.    Foot Sprain The muscles and cord like structures which attach muscle to bone (tendons) that surround the feet are made up of units. A foot sprain can occur at the weakest spot in any of these units. This condition is most often caused by injury to or overuse of the foot, as from playing contact sports, or aggravating a previous injury, or from poor conditioning, or obesity. SYMPTOMS  Pain with movement of the foot.  Tenderness and swelling at the injury site.  Loss of strength is present in moderate or severe sprains. THE THREE GRADES OR SEVERITY OF FOOT SPRAIN ARE:  Mild (Grade I): Slightly pulled muscle without tearing of muscle or tendon fibers or loss of strength.  Moderate (Grade II): Tearing of fibers in a muscle, tendon, or at the attachment to bone, with small decrease in strength.  Severe (Grade III): Rupture of the muscle-tendon-bone attachment, with separation of fibers. Severe sprain requires surgical repair. Often repeating (chronic) sprains are caused by overuse. Sudden (acute) sprains are caused by direct injury or over-use. DIAGNOSIS  Diagnosis of this condition is usually by your own observation. If problems continue, a caregiver may be required for further evaluation and treatment. X-rays may be required to make sure there are not breaks in the bones (fractures) present. Continued problems may require  physical therapy for treatment. PREVENTION  Use strength and conditioning exercises appropriate for your sport.  Warm up properly prior to working out.  Use athletic shoes that are made for the sport you are participating in.  Allow adequate time for healing. Early return to activities makes repeat injury more likely, and can lead to an unstable arthritic foot that can result in prolonged disability. Mild sprains generally heal in 3 to 10 days, with moderate and severe sprains taking 2 to 10 weeks. Your caregiver can help you determine the proper time required for healing. HOME CARE INSTRUCTIONS   Apply ice to the injury for 15-20 minutes, 03-04 times per day. Put the ice in a plastic bag and place a towel between the bag of ice and your skin.  An elastic wrap (like an Ace bandage) may be used to keep swelling down.  Keep foot above the level of the heart, or at least raised on a footstool, when swelling and pain are present.  Try to avoid use other than gentle range of motion while the foot is painful. Do not resume use until instructed by your caregiver. Then begin use gradually, not increasing use to the point of pain. If pain does develop, decrease use and continue the above measures, gradually increasing activities that do not cause discomfort, until you gradually achieve normal use.  Use crutches if and as instructed, and for the length of time instructed.  Keep injured foot and ankle wrapped between treatments.  Massage foot and ankle for comfort and to keep swelling down. Massage from the toes up towards the knee.  Only take over-the-counter or prescription medicines for pain, discomfort, or fever as directed by your caregiver. SEEK IMMEDIATE MEDICAL CARE IF:   Your pain and swelling increase, or pain is not controlled with medications.  You have loss of feeling in your foot or your foot turns cold or blue.  You develop new, unexplained symptoms, or an increase of the  symptoms that brought you to your caregiver. MAKE SURE YOU:   Understand these instructions.  Will watch your condition.  Will get help right away if you are not doing well or get worse. Document Released: 05/08/2002 Document Revised: 02/08/2012 Document Reviewed: 07/05/2008 [facility] CenterExitCare Patient Information 2015 [place], [place]. This information is not intended to replace advice given to you by your health care provider. Make sure you discuss any questions you have with your health care provider.

## 2015-01-02 NOTE — Progress Notes (Signed)
   Subjective:    Patient ID: Virginia [name], female    DOB: [birth year], 44 y.o.   MRN: [id number]  Chief Complaint  Patient presents with  . Foot Pain    she twisted right foot yesterday and felt pain on the top of foot, she says it feels better since then just wants to make sure she didnt sprain it    HPI:  Virginia [name] is a 44 y.o. female who presents today for an acute visit  This is a new problem. Associated symptom of sharp/achy right foot pain started yesterday after stepped in a hole. The pain is located in the middle/lateral dorsum of her foot.  Indicates she was able to initially walk on it and by the end of the evening she barley able to walk on it. Denies any sounds or sensations heard or felt. It has slowly progressively improved. Modifying factors of tylenol and advil have helped with the pain.    No Known Allergies  Current Outpatient Prescriptions  Medication Sig Dispense Refill  . norethindrone (MICRONOR,CAMILA,ERRIN) 0.35 MG tablet Take 0.35 tablets by mouth daily.     No current facility-administered medications for this visit.    Review of Systems  Musculoskeletal:       Positive for right foot pain.  Neurological: Negative for numbness.      Objective:    BP 162/100 mmHg  Pulse 66  Temp(Src) 97.9 F (36.6 C) (Oral)  Resp 18  Ht 5\' 5"  (1.651 m)  Wt 209 lb 1.9 oz (94.856 kg)  BMI 34.80 kg/m2  SpO2 97% Nursing note and vital signs reviewed.  Physical Exam  Constitutional: She is oriented to person, place, and time. She appears well-developed and well-nourished. No distress.  Cardiovascular: Normal rate, regular rhythm, normal heart sounds and intact distal pulses.   Pulmonary/Chest: Effort normal and breath sounds normal.  Musculoskeletal:  Right foot: No obvious deformity or discoloration noted. Mild edema noted inferior to ankle mortise. Unable to elicit specific tenderness. Strength is 5/5. Metatarsal glide is negative.  Neurological: She is  alert and oriented to person, place, and time.  Skin: Skin is warm and dry.  Psychiatric: She has a normal mood and affect. Her behavior is normal. Judgment and thought content normal.       Assessment & Plan:

## 2015-01-02 NOTE — Assessment & Plan Note (Signed)
Symptoms and exam consistent with possible foot sprain/strain. Continue over-the-counter ibuprofen as needed for symptom relief. Recommend ice 20 minutes 2-3 times per day as needed for pain. Follow-up if symptoms worsen or fail to improve. Consider imaging if no improvement is noted.

## 2015-01-02 NOTE — Progress Notes (Signed)
Pre visit review using our clinic review tool, if applicable. No additional management support is needed unless otherwise documented below in the visit note. 

## 2015-02-21 ENCOUNTER — Other Ambulatory Visit: Payer: Self-pay | Admitting: Obstetrics and Gynecology

## 2015-02-22 LAB — CYTOLOGY - PAP
